# Patient Record
Sex: Female | Born: 1980 | Race: White | Hispanic: No | Marital: Single | Smoking: Former smoker
Health system: Southern US, Community
[De-identification: ages and names within clinical notes are randomized; demographics above are authoritative.]

## PROBLEM LIST (undated history)

## (undated) DIAGNOSIS — F419 Anxiety disorder, unspecified: Secondary | ICD-10-CM

## (undated) DIAGNOSIS — J4540 Moderate persistent asthma, uncomplicated: Secondary | ICD-10-CM

## (undated) DIAGNOSIS — J309 Allergic rhinitis, unspecified: Secondary | ICD-10-CM

## (undated) DIAGNOSIS — E785 Hyperlipidemia, unspecified: Secondary | ICD-10-CM

## (undated) DIAGNOSIS — F32A Depression, unspecified: Secondary | ICD-10-CM

## (undated) DIAGNOSIS — E041 Nontoxic single thyroid nodule: Secondary | ICD-10-CM

## (undated) DIAGNOSIS — J45909 Unspecified asthma, uncomplicated: Secondary | ICD-10-CM

## (undated) DIAGNOSIS — Z973 Presence of spectacles and contact lenses: Secondary | ICD-10-CM

## (undated) DIAGNOSIS — N393 Stress incontinence (female) (male): Secondary | ICD-10-CM

## (undated) DIAGNOSIS — R3914 Feeling of incomplete bladder emptying: Secondary | ICD-10-CM

## (undated) DIAGNOSIS — Z1502 Genetic susceptibility to malignant neoplasm of ovary: Secondary | ICD-10-CM

## (undated) DIAGNOSIS — N812 Incomplete uterovaginal prolapse: Secondary | ICD-10-CM

## (undated) DIAGNOSIS — Z15068 Genetic susceptibility to other malignant neoplasm of digestive system: Secondary | ICD-10-CM

## (undated) DIAGNOSIS — Z1501 Genetic susceptibility to malignant neoplasm of breast: Secondary | ICD-10-CM

## (undated) DIAGNOSIS — Z1509 Genetic susceptibility to other malignant neoplasm: Secondary | ICD-10-CM

## (undated) HISTORY — DX: Genetic susceptibility to malignant neoplasm of breast: Z15.01

## (undated) HISTORY — DX: Depression, unspecified: F32.A

## (undated) HISTORY — DX: Anxiety disorder, unspecified: F41.9

## (undated) HISTORY — DX: Genetic susceptibility to other malignant neoplasm of digestive system: Z15.068

## (undated) HISTORY — DX: Genetic susceptibility to malignant neoplasm of breast: Z15.09

## (undated) HISTORY — PX: WISDOM TOOTH EXTRACTION: SHX21

## (undated) HISTORY — DX: Genetic susceptibility to malignant neoplasm of breast: Z15.02

## (undated) HISTORY — PX: TUBAL LIGATION: SHX77

## (undated) HISTORY — DX: Hyperlipidemia, unspecified: E78.5

## (undated) HISTORY — DX: Unspecified asthma, uncomplicated: J45.909

---

## 2019-01-26 DIAGNOSIS — F32A Depression, unspecified: Secondary | ICD-10-CM | POA: Insufficient documentation

## 2020-05-08 DIAGNOSIS — F411 Generalized anxiety disorder: Secondary | ICD-10-CM | POA: Insufficient documentation

## 2020-05-08 DIAGNOSIS — J4540 Moderate persistent asthma, uncomplicated: Secondary | ICD-10-CM | POA: Insufficient documentation

## 2020-05-08 DIAGNOSIS — E785 Hyperlipidemia, unspecified: Secondary | ICD-10-CM | POA: Insufficient documentation

## 2020-07-04 DIAGNOSIS — E040 Nontoxic diffuse goiter: Secondary | ICD-10-CM | POA: Insufficient documentation

## 2020-07-04 DIAGNOSIS — F909 Attention-deficit hyperactivity disorder, unspecified type: Secondary | ICD-10-CM | POA: Insufficient documentation

## 2020-09-10 HISTORY — PX: LAPAROSCOPIC TUBAL LIGATION: SUR803

## 2020-11-07 ENCOUNTER — Telehealth: Payer: Self-pay | Admitting: *Deleted

## 2020-11-07 NOTE — Telephone Encounter (Signed)
Called and scheduled the patient for a new patient appt on 3/25 at 10:30 am; arrive at 10 am. Patient giving the address and phone number for the clinic. Patient also given the policy for mask and visitors

## 2020-11-14 ENCOUNTER — Encounter: Payer: Self-pay | Admitting: Gynecologic Oncology

## 2020-11-15 ENCOUNTER — Inpatient Hospital Stay: Payer: BC Managed Care – PPO | Attending: Gynecologic Oncology | Admitting: Gynecologic Oncology

## 2020-11-15 ENCOUNTER — Other Ambulatory Visit: Payer: Self-pay

## 2020-11-15 ENCOUNTER — Encounter: Payer: Self-pay | Admitting: Gynecologic Oncology

## 2020-11-15 ENCOUNTER — Telehealth: Payer: Self-pay | Admitting: Oncology

## 2020-11-15 VITALS — BP 131/72 | HR 87 | Temp 97.5°F | Resp 18 | Ht 67.0 in | Wt 185.0 lb

## 2020-11-15 DIAGNOSIS — F32A Depression, unspecified: Secondary | ICD-10-CM | POA: Insufficient documentation

## 2020-11-15 DIAGNOSIS — E785 Hyperlipidemia, unspecified: Secondary | ICD-10-CM | POA: Diagnosis not present

## 2020-11-15 DIAGNOSIS — F419 Anxiety disorder, unspecified: Secondary | ICD-10-CM | POA: Insufficient documentation

## 2020-11-15 DIAGNOSIS — Z1502 Genetic susceptibility to malignant neoplasm of ovary: Secondary | ICD-10-CM | POA: Diagnosis present

## 2020-11-15 DIAGNOSIS — J45909 Unspecified asthma, uncomplicated: Secondary | ICD-10-CM | POA: Insufficient documentation

## 2020-11-15 DIAGNOSIS — Z148 Genetic carrier of other disease: Secondary | ICD-10-CM | POA: Diagnosis not present

## 2020-11-15 DIAGNOSIS — Z1501 Genetic susceptibility to malignant neoplasm of breast: Secondary | ICD-10-CM | POA: Insufficient documentation

## 2020-11-15 DIAGNOSIS — Z1509 Genetic susceptibility to other malignant neoplasm: Secondary | ICD-10-CM | POA: Insufficient documentation

## 2020-11-15 DIAGNOSIS — Z79899 Other long term (current) drug therapy: Secondary | ICD-10-CM | POA: Diagnosis not present

## 2020-11-15 MED ORDER — IBUPROFEN 800 MG PO TABS: 800.0000 mg | ORAL_TABLET | Freq: Three times a day (TID) | ORAL | 0 refills | Status: DC | PRN

## 2020-11-15 MED ORDER — TRAMADOL HCL 50 MG PO TABS: 50.0000 mg | ORAL_TABLET | Freq: Four times a day (QID) | ORAL | 0 refills | Status: DC | PRN

## 2020-11-15 MED ORDER — SENNOSIDES-DOCUSATE SODIUM 8.6-50 MG PO TABS: 2.0000 | ORAL_TABLET | Freq: Every day | ORAL | 0 refills | Status: DC

## 2020-11-15 NOTE — Telephone Encounter (Signed)
Spoke with Dr. Anola Gurney with [facility].  He verified that patient's specimen from 09/10/20 surgery was serial sectioned.

## 2020-11-15 NOTE — Progress Notes (Signed)
GYNECOLOGIC ONCOLOGY NEW PATIENT CONSULTATION   Patient Name: Ebony Watson  Patient Age: 40 y.o. Date of Service: 11/15/20 Referring Provider: Self referred  Primary Care Provider: Pcp, No Consulting Provider: Jeral Pinch, MD   Assessment/Plan:  Premenopausal patient with recent diagnosis of a BRCA2 mutation.  Ebony Watson and I discussed in detail her recent genetic testing and the implications of these results. The risk of ovarian cancer in patients with BRCA 2 mutations is approximately 10-15% to the age of 40.  In addition, patients with BRCA mutations are also at increased risk of breast cancer, and an increased (but still low) risk of pancreatic cancer and melanoma.  The [employer]  (NCCN) recommends consideration of removal of bilateral ovaries and fallopian tubes starting between the ages of 47-40, and/or when childbearing is completed, to reduce the risk of ovarian and fallopian tube cancer.  In BRCA2 mutation specifically, given the later average onset of ovarian cancer, it is reasonable to delay risk reducing surgery until 15-80 years of age.  If a patient does not undergo risk-reducing surgery, it is recommended they have CA-125 serum levels and pelvic ultrasounds every 6 months as a screening approach, although these are not particularly sensitive methods of screening.  Specifically, we discussed that there is not truly a screening test for ovarian cancer.  While we use ultrasounds and CA-125, these have not been reliably shown to help diagnose ovarian cancer at an earlier stage and thus influence outcomes.  Preventive surgery to remove the ovaries and fallopian tubes reduces the risk of a related cancer by 80% in women who carry a BRCA1 or BRCA2 mutation.  Women who undergo preventive surgery retain a 4% risk of developing cancer of the peritoneum.   We discussed the role of hysterectomy.  In the setting of a BRCA2 mutation, we discussed that there  is no increased or associated risk of uterine cancer.  Given her age, no personal history of breast cancer, she would be a candidate for hormonal replacement with estrogen.  With a uterus still in situ, if we were to place her on estrogen replacement, then she would need some protection of her uterine lining.  Given risks associated with combination estrogen and progesterone, my recommendation would be protection of the uterine lining with a Mirena IUD.  The patient has started having breast cancer screening but has not met with a medical oncologist.  I offered to make a referral to our high risk breast clinic and she was amenable to this.  We tentatively scheduled risk reducing surgery with robotic oophorectomy.  Since she has already undergone bilateral salpingectomy, will confirm with the pathologist at [facility] that this included serial sectioning.  On the pathology report, is not clear to me that they did this, and at that time only her family history and not personal BRCA2 mutation status was known.  We discussed the small but present risk of an incidental cancer diagnosis at the time of surgery.  If this were to happen, then appropriate staging would be performed within the same surgery including total hysterectomy, lymph node sampling, omentectomy, and peritoneal biopsies.  If microscopic cancer was detected in the ovaries after the removal, then we would review this at a follow-up visit and discuss the need for any additional surgery.  The risks of surgery were discussed with the patient including bleeding, need for blood transfusion, infection, damage to surrounding structures with need for repair (including bowel, bladder, blood vessels, nerves), medical complications (including cardiac arrest,  pneumonia, DVT, and rarely death), wound separation, hernia, unforeseen complications, possible need for re-exploration. The patient will receive DVT and antibiotic prophylaxis as indicated. She voiced a clear  understanding. She had the opportunity to ask questions. Perioperative instructions were reviewed with her.   A copy of this note was sent to the patient's referring provider.   70 minutes of total time was spent for this patient encounter, including preparation, face-to-face counseling with the patient and coordination of care, and documentation of the encounter.   Jeral Pinch, MD  Division of Gynecologic Oncology  Department of Obstetrics and Gynecology  [facility]  ___________________________________________  Chief Complaint: Chief Complaint  Patient presents with  . BRCA2 positive    History of Present Illness:  Ebony Watson is a 40 y.o. y.o. female who is seen in consultation at the request of No ref. provider found for an evaluation of risk reducing options in the setting of BRCA2 mutation.  Patient has gotten care at [facility] and after completing childbearing plans, decided to move forward with surgical sterilization.  She was counseled on the options including bilateral salpingectomy.  On 09/10/2020, the patient underwent laparoscopic surgical sterilization with bilateral salpingectomy.  Findings at the time of surgery were an anteverted mobile uterus and cervix.  Normal bilateral adnexa noted on laparoscopy as well as a normal uterine corpus.  Pathology results from this showed fallopian tubes were negative for malignancy.  On the pathology report that we received, I do not see any evidence of serial sectioning being done on the fallopian tubes.  At the time of surgery, patient's family history was known but the patient had not had genetic testing.  She recovered well from surgery.  She denies any baseline pelvic pain.  She has a history of normal menses although her periods stopped last year after she got her third COVID booster injection and restarted in January.  This month was her first truly normal menses.  She endorses a good  appetite with some weight variation at baseline.  She denies any early satiety or bloating.  She reports normal bowel and bladder function.  She underwent genetic testing in mid February showing a pathogenic mutation in BRCA2.  She has not met with a medical oncologist.  She had her first breast mammogram last year and is scheduled for breast MRI next month.  In terms of her medical history, she is got well-controlled asthma on Advair and uses an albuterol inhaler as needed.  She has increased use with seasonal exposures.  Family history is notable for an aunt who presented with late stage ovarian cancer and was found to have a BRCA2 mutation.  She was subsequently diagnosed with an early stage breast cancer.  Patient lives in [place] with her 3 children.  She works in infection control.  PAST MEDICAL HISTORY:  Past Medical History:  Diagnosis Date  . Anxiety   . Asthma   . BRCA2 gene mutation positive in female   . Depression   . Hyperlipidemia      PAST SURGICAL HISTORY:  Past Surgical History:  Procedure Laterality Date  . TUBAL LIGATION    . WISDOM TOOTH EXTRACTION      OB/GYN HISTORY:  OB History  Gravida Para Term Preterm AB Living  5 3          SAB IAB Ectopic Multiple Live Births               # Outcome Date GA  Lbr Len/2nd Weight Sex Delivery Anes PTL Lv  5 Gravida           4 Gravida           3 Para           2 Para           1 Para             No LMP recorded.  Age at menarche: 72 Age at menopause: Insertion Hx of HRT: N/ Hx of STDs: N/A Last pap: 2021 History of abnormal pap smears: No  SCREENING STUDIES:  Last mammogram: 2021   MEDICATIONS: Outpatient Encounter Medications as of 11/15/2020  Medication Sig  . ADVAIR DISKUS 250-50 MCG/DOSE AEPB Inhale 1 puff into the lungs 2 (two) times daily.  Marland Kitchen albuterol (VENTOLIN HFA) 108 (90 Base) MCG/ACT inhaler SMARTSIG:2 Puff(s) By Mouth Every 6 Hours PRN  . Cholecalciferol 125 MCG (5000 UT) capsule Take by  mouth daily.  Marland Kitchen ibuprofen (ADVIL) 800 MG tablet Take 1 tablet (800 mg total) by mouth every 8 (eight) hours as needed for moderate pain. For AFTER surgery only  . loratadine (CLARITIN) 10 MG tablet 10 mg daily. Prn allergies  . Multiple Vitamin (MULTI-VITAMIN DAILY PO) Take 1 tablet by mouth daily.  . Omega-3 Fatty Acids (FISH OIL) 1000 MG CAPS daily.  . Probiotic Product (PROBIOTIC DAILY PO) Take 1 tablet by mouth daily.  Marland Kitchen senna-docusate (SENOKOT-S) 8.6-50 MG tablet Take 2 tablets by mouth at bedtime. For AFTER surgery, do not take if having diarrhea  . traMADol (ULTRAM) 50 MG tablet Take 1 tablet (50 mg total) by mouth every 6 (six) hours as needed for severe pain. For AFTER surgery only, do not take and drive   No facility-administered encounter medications on file as of 11/15/2020.    ALLERGIES:  Allergies  Allergen Reactions  . Hydrocodone-Acetaminophen Shortness Of Breath  . Oxycodone-Acetaminophen Shortness Of Breath     FAMILY HISTORY:  Family History  Problem Relation Age of Onset  . Drug abuse Mother   . Hypertension Mother   . Hyperlipidemia Mother   . Heart disease Father   . Prostate cancer Father   . Drug abuse Sister   . Breast cancer Maternal Aunt   . Ovarian cancer Maternal Aunt   . BRCA 1/2 Maternal Aunt   . Cervical cancer Sister   . Drug abuse Sister      SOCIAL HISTORY:  Social Connections: Not on file    REVIEW OF SYSTEMS:  Denies appetite changes, fevers, chills, fatigue, unexplained weight changes. Denies hearing loss, neck lumps or masses, mouth sores, ringing in ears or voice changes. Denies cough or wheezing.  Denies shortness of breath. Denies chest pain or palpitations. Denies leg swelling. Denies abdominal distention, pain, blood in stools, constipation, diarrhea, nausea, vomiting, or early satiety. Denies pain with intercourse, dysuria, frequency, hematuria or incontinence. Denies hot flashes, pelvic pain, vaginal bleeding or vaginal  discharge.   Denies joint pain, back pain or muscle pain/cramps. Denies itching, rash, or wounds. Denies dizziness, headaches, numbness or seizures. Denies swollen lymph nodes or glands, denies easy bruising or bleeding. Denies anxiety, depression, confusion, or decreased concentration.  Physical Exam:  Vital Signs for this encounter:  Blood pressure 131/72, pulse 87, temperature (!) 97.5 F (36.4 C), temperature source Tympanic, resp. rate 18, height '5\' 7"'  (1.702 m), weight 185 lb (83.9 kg), SpO2 100 %. Body mass index is 28.98 kg/m. General: Alert, oriented, no acute distress.  HEENT: Normocephalic, atraumatic. Sclera anicteric.  Chest: Clear to auscultation bilaterally. No wheezes, rhonchi, or rales. Cardiovascular: Regular rate and rhythm, no murmurs, rubs, or gallops.  Abdomen: Normoactive bowel sounds. Soft, nondistended, nontender to palpation. No masses or hepatosplenomegaly appreciated. No palpable fluid wave.  Well-healed laparoscopic incisions. Extremities: Grossly normal range of motion. Warm, well perfused. No edema bilaterally.   LABORATORY AND RADIOLOGIC DATA:  Outside medical records were reviewed to synthesize the above history, along with the history and physical obtained during the visit.   No results found for: WBC, HGB, HCT, PLT, GLUCOSE, CHOL, TRIG, HDL, LDLDIRECT, LDLCALC, ALT, AST, NA, K, CL, CREATININE, BUN, CO2, TSH, PSA, INR, GLUF, HGBA1C, MICROALBUR  Genetic testing performed with [facility] on 09/26/2020: Positive for pathogenic mutation in [place], [id number]

## 2020-11-15 NOTE — H&P (View-Only) (Signed)
GYNECOLOGIC ONCOLOGY NEW PATIENT CONSULTATION   Patient Name: Ebony Watson  Patient Age: 40 y.o. Date of Service: 11/15/20 Referring Provider: Self referred  Primary Care Provider: Pcp, No Consulting Provider: Jeral Pinch, MD   Assessment/Plan:  Premenopausal patient with recent diagnosis of a BRCA2 mutation.  Larene and I discussed in detail her recent genetic testing and the implications of these results. The risk of ovarian cancer in patients with BRCA 2 mutations is approximately 10-15% to the age of 42.  In addition, patients with BRCA mutations are also at increased risk of breast cancer, and an increased (but still low) risk of pancreatic cancer and melanoma.  [employer]  (NCCN) recommends consideration of removal of bilateral ovaries and fallopian tubes starting between the ages of 68-40, and/or when childbearing is completed, to reduce the risk of ovarian and fallopian tube cancer.  In BRCA2 mutation specifically, given the later average onset of ovarian cancer, it is reasonable to delay risk reducing surgery until 59-56 years of age.  If a patient does not undergo risk-reducing surgery, it is recommended they have CA-125 serum levels and pelvic ultrasounds every 6 months as a screening approach, although these are not particularly sensitive methods of screening.  Specifically, we discussed that there is not truly a screening test for ovarian cancer.  While we use ultrasounds and CA-125, these have not been reliably shown to help diagnose ovarian cancer at an earlier stage and thus influence outcomes.  Preventive surgery to remove the ovaries and fallopian tubes reduces the risk of a related cancer by 80% in women who carry a BRCA1 or BRCA2 mutation.  Women who undergo preventive surgery retain a 4% risk of developing cancer of the peritoneum.   We discussed the role of hysterectomy.  In the setting of a BRCA2 mutation, we discussed that there  is no increased or associated risk of uterine cancer.  Given her age, no personal history of breast cancer, she would be a candidate for hormonal replacement with estrogen.  With a uterus still in situ, if we were to place her on estrogen replacement, then she would need some protection of her uterine lining.  Given risks associated with combination estrogen and progesterone, my recommendation would be protection of the uterine lining with a Mirena IUD.  The patient has started having breast cancer screening but has not met with a medical oncologist.  I offered to make a referral to our high risk breast clinic and she was amenable to this.  We tentatively scheduled risk reducing surgery with robotic oophorectomy.  Since she has already undergone bilateral salpingectomy, will confirm with the pathologist at [facility] that this included serial sectioning.  On the pathology report, is not clear to me that they did this, and at that time only her family history and not personal BRCA2 mutation status was known.  We discussed the small but present risk of an incidental cancer diagnosis at the time of surgery.  If this were to happen, then appropriate staging would be performed within the same surgery including total hysterectomy, lymph node sampling, omentectomy, and peritoneal biopsies.  If microscopic cancer was detected in the ovaries after the removal, then we would review this at a follow-up visit and discuss the need for any additional surgery.  The risks of surgery were discussed with the patient including bleeding, need for blood transfusion, infection, damage to surrounding structures with need for repair (including bowel, bladder, blood vessels, nerves), medical complications (including cardiac arrest,  pneumonia, DVT, and rarely death), wound separation, hernia, unforeseen complications, possible need for re-exploration. The patient will receive DVT and antibiotic prophylaxis as indicated. She voiced a clear  understanding. She had the opportunity to ask questions. Perioperative instructions were reviewed with her.   A copy of this note was sent to the patient's referring provider.   70 minutes of total time was spent for this patient encounter, including preparation, face-to-face counseling with the patient and coordination of care, and documentation of the encounter.   Jeral Pinch, MD  Division of Gynecologic Oncology  Department of Obstetrics and Gynecology  [facility]  ___________________________________________  Chief Complaint: Chief Complaint  Patient presents with  . BRCA2 positive    History of Present Illness:  Ebony Watson is a 40 y.o. y.o. female who is seen in consultation at the request of No ref. provider found for an evaluation of risk reducing options in the setting of BRCA2 mutation.  Patient has gotten care at [facility] and after completing childbearing plans, decided to move forward with surgical sterilization.  She was counseled on the options including bilateral salpingectomy.  On 09/10/2020, the patient underwent laparoscopic surgical sterilization with bilateral salpingectomy.  Findings at the time of surgery were an anteverted mobile uterus and cervix.  Normal bilateral adnexa noted on laparoscopy as well as a normal uterine corpus.  Pathology results from this showed fallopian tubes were negative for malignancy.  On the pathology report that we received, I do not see any evidence of serial sectioning being done on the fallopian tubes.  At the time of surgery, patient's family history was known but the patient had not had genetic testing.  She recovered well from surgery.  She denies any baseline pelvic pain.  She has a history of normal menses although her periods stopped last year after she got her third COVID booster injection and restarted in January.  This month was her first truly normal menses.  She endorses a good  appetite with some weight variation at baseline.  She denies any early satiety or bloating.  She reports normal bowel and bladder function.  She underwent genetic testing in mid February showing a pathogenic mutation in BRCA2.  She has not met with a medical oncologist.  She had her first breast mammogram last year and is scheduled for breast MRI next month.  In terms of her medical history, she is got well-controlled asthma on Advair and uses an albuterol inhaler as needed.  She has increased use with seasonal exposures.  Family history is notable for an aunt who presented with late stage ovarian cancer and was found to have a BRCA2 mutation.  She was subsequently diagnosed with an early stage breast cancer.  Patient lives in [place] with her 3 children.  She works in infection control.  PAST MEDICAL HISTORY:  Past Medical History:  Diagnosis Date  . Anxiety   . Asthma   . BRCA2 gene mutation positive in female   . Depression   . Hyperlipidemia      PAST SURGICAL HISTORY:  Past Surgical History:  Procedure Laterality Date  . TUBAL LIGATION    . WISDOM TOOTH EXTRACTION      OB/GYN HISTORY:  OB History  Gravida Para Term Preterm AB Living  5 3          SAB IAB Ectopic Multiple Live Births               # Outcome Date GA  Lbr Len/2nd Weight Sex Delivery Anes PTL Lv  5 Gravida           4 Gravida           3 Para           2 Para           1 Para             No LMP recorded.  Age at menarche: 2 Age at menopause: Insertion Hx of HRT: N/ Hx of STDs: N/A Last pap: 2021 History of abnormal pap smears: No  SCREENING STUDIES:  Last mammogram: 2021   MEDICATIONS: Outpatient Encounter Medications as of 11/15/2020  Medication Sig  . ADVAIR DISKUS 250-50 MCG/DOSE AEPB Inhale 1 puff into the lungs 2 (two) times daily.  Marland Kitchen albuterol (VENTOLIN HFA) 108 (90 Base) MCG/ACT inhaler SMARTSIG:2 Puff(s) By Mouth Every 6 Hours PRN  . Cholecalciferol 125 MCG (5000 UT) capsule Take by  mouth daily.  Marland Kitchen ibuprofen (ADVIL) 800 MG tablet Take 1 tablet (800 mg total) by mouth every 8 (eight) hours as needed for moderate pain. For AFTER surgery only  . loratadine (CLARITIN) 10 MG tablet 10 mg daily. Prn allergies  . Multiple Vitamin (MULTI-VITAMIN DAILY PO) Take 1 tablet by mouth daily.  . Omega-3 Fatty Acids (FISH OIL) 1000 MG CAPS daily.  . Probiotic Product (PROBIOTIC DAILY PO) Take 1 tablet by mouth daily.  Marland Kitchen senna-docusate (SENOKOT-S) 8.6-50 MG tablet Take 2 tablets by mouth at bedtime. For AFTER surgery, do not take if having diarrhea  . traMADol (ULTRAM) 50 MG tablet Take 1 tablet (50 mg total) by mouth every 6 (six) hours as needed for severe pain. For AFTER surgery only, do not take and drive   No facility-administered encounter medications on file as of 11/15/2020.    ALLERGIES:  Allergies  Allergen Reactions  . Hydrocodone-Acetaminophen Shortness Of Breath  . Oxycodone-Acetaminophen Shortness Of Breath     FAMILY HISTORY:  Family History  Problem Relation Age of Onset  . Drug abuse Mother   . Hypertension Mother   . Hyperlipidemia Mother   . Heart disease Father   . Prostate cancer Father   . Drug abuse Sister   . Breast cancer Maternal Aunt   . Ovarian cancer Maternal Aunt   . BRCA 1/2 Maternal Aunt   . Cervical cancer Sister   . Drug abuse Sister      SOCIAL HISTORY:  Social Connections: Not on file    REVIEW OF SYSTEMS:  Denies appetite changes, fevers, chills, fatigue, unexplained weight changes. Denies hearing loss, neck lumps or masses, mouth sores, ringing in ears or voice changes. Denies cough or wheezing.  Denies shortness of breath. Denies chest pain or palpitations. Denies leg swelling. Denies abdominal distention, pain, blood in stools, constipation, diarrhea, nausea, vomiting, or early satiety. Denies pain with intercourse, dysuria, frequency, hematuria or incontinence. Denies hot flashes, pelvic pain, vaginal bleeding or vaginal  discharge.   Denies joint pain, back pain or muscle pain/cramps. Denies itching, rash, or wounds. Denies dizziness, headaches, numbness or seizures. Denies swollen lymph nodes or glands, denies easy bruising or bleeding. Denies anxiety, depression, confusion, or decreased concentration.  Physical Exam:  Vital Signs for this encounter:  Blood pressure 131/72, pulse 87, temperature (!) 97.5 F (36.4 C), temperature source Tympanic, resp. rate 18, height '5\' 7"'  (1.702 m), weight 185 lb (83.9 kg), SpO2 100 %. Body mass index is 28.98 kg/m. General: Alert, oriented, no acute distress.  HEENT: Normocephalic, atraumatic. Sclera anicteric.  Chest: Clear to auscultation bilaterally. No wheezes, rhonchi, or rales. Cardiovascular: Regular rate and rhythm, no murmurs, rubs, or gallops.  Abdomen: Normoactive bowel sounds. Soft, nondistended, nontender to palpation. No masses or hepatosplenomegaly appreciated. No palpable fluid wave.  Well-healed laparoscopic incisions. Extremities: Grossly normal range of motion. Warm, well perfused. No edema bilaterally.   LABORATORY AND RADIOLOGIC DATA:  Outside medical records were reviewed to synthesize the above history, along with the history and physical obtained during the visit.   No results found for: WBC, HGB, HCT, PLT, GLUCOSE, CHOL, TRIG, HDL, LDLDIRECT, LDLCALC, ALT, AST, NA, K, CL, CREATININE, BUN, CO2, TSH, PSA, INR, GLUF, HGBA1C, MICROALBUR  Genetic testing performed with [facility] on 09/26/2020: Positive for pathogenic mutation in [place], [id number]

## 2020-11-15 NOTE — Patient Instructions (Signed)
Preparing for your Surgery  Plan for surgery in December 10, 2020 with Dr. Jeral Pinch at [place] will be scheduled for a robotic assisted laparoscopic bilateral oophorectomy (possible bilateral salpingo-oophorectomy), possible staging if cancer identified, Mirena IUD placement.  Pre-operative Testing -You will receive a phone call from presurgical testing at [facility] to arrange for a pre-operative appointment, labs, and COVID test. The COVID test normally happens 3 days prior to the surgery and they ask that you self quarantine after the test up until surgery to decrease chance of exposure.  -Bring your insurance card, copy of an advanced directive if applicable, medication list  -At that visit, you will be asked to sign a consent for a possible blood transfusion in case a transfusion becomes necessary during surgery.  The need for a blood transfusion is rare but having consent is a necessary part of your care.     -You should not be taking blood thinners or aspirin at least ten days prior to surgery unless instructed by your surgeon.  -Do not take supplements such as fish oil (omega 3), red yeast rice, turmeric before your surgery. You want to avoid medications with aspirin in them including headache powders such as BC or Goody's), Excedrin migraine. PLEASE STOP FISH OIL NOW.  Day Before Surgery at [place] will be asked to take in a light diet the day before surgery. You will be advised you can have clear liquids up until 3 hours before your surgery.    Eat a light diet the day before surgery.  Examples including soups, broths, toast, yogurt, mashed potatoes.  AVOID GAS PRODUCING FOODS. Things to avoid include carbonated beverages (fizzy beverages, sodas), raw fruits and raw vegetables (uncooked), or beans.   If your bowels are filled with gas, your surgeon will have difficulty visualizing your pelvic organs which increases your surgical risks.  Your role in  recovery Your role is to become active as soon as directed by your doctor, while still giving yourself time to heal.  Rest when you feel tired. You will be asked to do the following in order to speed your recovery:  - Cough and breathe deeply. This helps to clear and expand your lungs and can prevent pneumonia after surgery.  - [place]. Do mild physical activity. Walking or moving your legs help your circulation and body functions return to normal. Do not try to get up or walk alone the first time after surgery.   -If you develop swelling on one leg or the other, pain in the back of your leg, redness/warmth in one of your legs, please call the office or go to the Emergency Room to have a doppler to rule out a blood clot. For shortness of breath, chest pain-seek care in the Emergency Room as soon as possible. - Actively manage your pain. Managing your pain lets you move in comfort. We will ask you to rate your pain on a scale of zero to 10. It is your responsibility to tell your doctor or nurse where and how much you hurt so your pain can be treated.  Special Considerations -If you are diabetic, you may be placed on insulin after surgery to have closer control over your blood sugars to promote healing and recovery.  This does not mean that you will be discharged on insulin.  If applicable, your oral antidiabetics will be resumed when you are tolerating a solid diet.  -Your final pathology results  from surgery should be available around one week after surgery and the results will be relayed to you when available.  -Dr. Lahoma Crocker is the surgeon that assists your GYN Oncologist with surgery.  If you end up staying the night, the next day after your surgery you will either see Dr. Denman George, Dr. Berline Lopes, or Dr. Lahoma Crocker.  -FMLA forms can be faxed to [phone] and please allow 5-7 business days for completion.  Pain Management After Surgery -You have been prescribed  your pain medication and bowel regimen medications before surgery so that you can have these available when you are discharged from the hospital. The pain medication is for use ONLY AFTER surgery and a new prescription will not be given.   -Make sure that you have Tylenol and Ibuprofen at home to use on a regular basis after surgery for pain control. We recommend alternating the medications every hour to six hours since they work differently and are processed in the body differently for pain relief.  -Review the attached handout on narcotic use and their risks and side effects.   Bowel Regimen -You have been prescribed Sennakot-S to take nightly to prevent constipation especially if you are taking the narcotic pain medication intermittently.  It is important to prevent constipation and drink adequate amounts of liquids. You can stop taking this medication when you are not taking pain medication and you are back on your normal bowel routine.  Risks of Surgery Risks of surgery are low but include bleeding, infection, damage to surrounding structures, re-operation, blood clots, and very rarely death.   Blood Transfusion Information (For the consent to be signed before surgery)  We will be checking your blood type before surgery so in case of emergencies, we will know what type of blood you would need.                                            WHAT IS A BLOOD TRANSFUSION?  A transfusion is the replacement of blood or some of its parts. Blood is made up of multiple cells which provide different functions.  Red blood cells carry oxygen and are used for blood loss replacement.  White blood cells fight against infection.  Platelets control bleeding.  Plasma helps clot blood.  Other blood products are available for specialized needs, such as hemophilia or other clotting disorders. BEFORE THE TRANSFUSION  Who gives blood for transfusions?   You may be able to donate blood to be used at a  later date on yourself (autologous donation).  Relatives can be asked to donate blood. This is generally not any safer than if you have received blood from a stranger. The same precautions are taken to ensure safety when a relative's blood is donated.  Healthy volunteers who are fully evaluated to make sure their blood is safe. This is blood bank blood. Transfusion therapy is the safest it has ever been in the practice of medicine. Before blood is taken from a donor, a complete history is taken to make sure that person has no history of diseases nor engages in risky social behavior (examples are intravenous drug use or sexual activity with multiple partners). The donor's travel history is screened to minimize risk of transmitting infections, such as malaria. The donated blood is tested for signs of infectious diseases, such as HIV and hepatitis. The blood is then tested  to be sure it is compatible with you in order to minimize the chance of a transfusion reaction. If you or a relative donates blood, this is often done in anticipation of surgery and is not appropriate for emergency situations. It takes many days to process the donated blood. RISKS AND COMPLICATIONS Although transfusion therapy is very safe and saves many lives, the main dangers of transfusion include:   Getting an infectious disease.  Developing a transfusion reaction. This is an allergic reaction to something in the blood you were given. Every precaution is taken to prevent this. The decision to have a blood transfusion has been considered carefully by your caregiver before blood is given. Blood is not given unless the benefits outweigh the risks.  AFTER SURGERY INSTRUCTIONS  Return to work: 4 weeks if applicable  Activity: 1. Be up and out of the bed during the day.  Take a nap if needed.  You may walk up steps but be careful and use the hand rail.  Stair climbing will tire you more than you think, you may need to stop part way  and rest.   2. No lifting or straining for 6 weeks over 10 pounds. No pushing, pulling, straining for 6 weeks.  3. No driving for around 1 week(s).  Do not drive if you are taking narcotic pain medicine and make sure that your reaction time has returned.   4. You can shower as soon as the next day after surgery. Shower daily.  Use your regular soap and water (not directly on the incision) and pat your incision(s) dry afterwards; don't rub.  No tub baths or submerging your body in water until cleared by your surgeon. If you have the soap that was given to you by pre-surgical testing that was used before surgery, you do not need to use it afterwards because this can irritate your incisions.   5. No sexual activity and nothing in the vagina for 4 weeks.  6. You may experience a small amount of clear drainage from your incisions, which is normal.  If the drainage persists, increases, or changes color please call the office.  7. Do not use creams, lotions, or ointments such as neosporin on your incisions after surgery until advised by your surgeon because they can cause removal of the dermabond glue on your incisions.    8. You may experience vaginal spotting after surgery.  The spotting is normal but if you experience heavy bleeding, call our office.  9. Take Tylenol or ibuprofen first for pain and only use narcotic pain medication for severe pain not relieved by the Tylenol or Ibuprofen.  Monitor your Tylenol intake to a max of 4,000 mg in a 24 hour period. You can alternate these medications after surgery.  Diet: 1. Low sodium Heart Healthy Diet is recommended but you are cleared to resume your normal (before surgery) diet after your procedure.  2. It is safe to use a laxative, such as Miralax or Colace, if you have difficulty moving your bowels. You have been prescribed Sennakot at bedtime every evening to keep bowel movements regular and to prevent constipation.    Wound Care: 1. Keep clean  and dry.  Shower daily.  Reasons to call the Doctor:  Fever - Oral temperature greater than 100.4 degrees Fahrenheit  Foul-smelling vaginal discharge  Difficulty urinating  Nausea and vomiting  Increased pain at the site of the incision that is unrelieved with pain medicine.  Difficulty breathing with or without chest  pain  New calf pain especially if only on one side  Sudden, continuing increased vaginal bleeding with or without clots.   Contacts: For questions or concerns you should contact:  Dr. Jeral Pinch at [phone]  Joylene John, NP at [phone]  After Hours: call [phone] and have the GYN Oncologist paged/contacted (after 5 pm or on the weekends).  Messages sent via mychart are for non-urgent matters and are not responded to after hours so for urgent needs, please call the after hours number.   Intrauterine Device Insertion  An intrauterine device (IUD) is a medical device that gets inserted into the uterus to prevent pregnancy. It is a small, T-shaped device that has one or two nylon strings hanging down from it. The strings hang out of the lower part of the uterus (cervix) to allow for future IUD removal. There are two types of IUDs available:  Copper IUD. This type of IUD has copper wire wrapped around it. Copper makes the uterus and fallopian tubes produce a fluid that kills sperm. A copper IUD may last up to 10 years.  Hormone IUD. This type of IUD is made of plastic and contains the hormone progestin (synthetic progesterone). The hormone thickens mucus in the cervix and prevents sperm from entering the uterus. It also thins the uterine lining to prevent implantation of a fertilized egg. The hormone can weaken or kill the sperm that get into the uterus. A hormone IUD may last 3-5 years. Tell a health care provider about:  Any allergies you have.  All medicines you are taking, including vitamins, herbs, eye drops, creams, and over-the-counter  medicines.  Any problems you or family members have had with anesthetic medicines.  Any blood disorders you have.  Any surgeries you have had.  Any medical conditions you have, including any STIs (sexually transmitted infections) you may have.  Whether you are pregnant or may be pregnant. What are the risks? Generally, this is a safe procedure. However, problems may occur, including:  Infection.  Bleeding.  Allergic reactions to medicines.  Accidental puncture (perforation) of the uterus, or damage to other structures or organs.  Accidental placement of the IUD either in the muscle layer of the uterus (myometrium) or outside the uterus.  The IUD falling out of the uterus (expulsion). This is more common among women who have recently had a child.  Pregnancy that happens in the fallopian tube (ectopic pregnancy).  Infection of the uterus and fallopian tubes (pelvic inflammatory disease). What happens before the procedure?  Schedule the IUD insertion for when you will have your menstrual period or right after, to make sure you are not pregnant. Placement of the IUD is better tolerated shortly after a menstrual cycle.  Follow instructions from your health care provider about eating or drinking restrictions.  Ask your health care provider about changing or stopping your regular medicines. This is especially important if you are taking diabetes medicines or blood thinners.  You may get a pain reliever to take before the procedure.  You may have tests for:  Pregnancy. A pregnancy test involves having a urine sample taken.  STIs. Placing an IUD in someone who has an STI can make the infection worse.  Cervical cancer. You may have a Pap test to check for this type of cancer. This means collecting cells from your cervix to be examined under a microscope.  You may have a physical exam to determine the size and position of your uterus. The procedure may vary among  health care  providers and hospitals. What happens during the procedure?  A tool (speculum) will be placed in your vagina and widened so that your health care provider can see your cervix.  Medicine may be applied to your cervix to help lower your risk of infection (antiseptic medicine).  You may be given an anesthetic medicine to numb each side of your cervix (intracervical block or paracervical block). This medicine is usually given by an injection into the cervix.  A tool (uterine sound) will be inserted into your uterus to determine the length of your uterus and the direction that your uterus may be tilted.  A slim instrument or tube (IUD inserter) that holds the IUD will be inserted into your vagina, through your cervical canal, and into your uterus.  The IUD will be placed in the uterus, and the IUD inserter will be removed.  The strings that are attached to the IUD will be trimmed so that they lie just below the cervix. The procedure may vary among health care providers and hospitals. What happens after the procedure?  You may have bleeding after the procedure. This is normal. It varies from light bleeding (spotting) for a few days to menstrual-like bleeding.  You may have cramping and pain.  You may feel dizzy or light-headed.  You may have lower back pain. Summary  An intrauterine device (IUD) is a small, T-shaped device that has one or two nylon strings hanging down from it.  Two types of IUDs are available. You may have a copper IUD or a hormone IUD.  Schedule the IUD insertion for when you will have your menstrual period or right after, to make sure you are not pregnant. Placement of the IUD is better tolerated shortly after a menstrual cycle.  You may have bleeding after the procedure. This is normal. It varies from light spotting for a few days to menstrual-like bleeding. This information is not intended to replace advice given to you by your health care provider. Make sure you  discuss any questions you have with your health care provider. Document Released: 04/08/2011 Document Revised: 07/01/2016 Document Reviewed: 07/01/2016 Elsevier Interactive Patient Education  2017 [employer].  Levonorgestrel intrauterine device (IUD) What is this medicine? LEVONORGESTREL IUD (LEE voe nor jes trel) is a contraceptive (birth control) device. The device is placed inside the uterus by a healthcare professional. It is used to prevent pregnancy. This device can also be used to treat heavy bleeding that occurs during your period. This medicine may be used for other purposes; ask your health care provider or pharmacist if you have questions. COMMON BRAND NAME(S): Minette Headland What should I tell my health care provider before I take this medicine? They need to know if you have any of these conditions: -abnormal Pap smear -cancer of the breast, uterus, or cervix -diabetes -endometritis -genital or pelvic infection now or in the past -have more than one sexual partner or your partner has more than one partner -heart disease -history of an ectopic or tubal pregnancy -immune system problems -IUD in place -liver disease or tumor -problems with blood clots or take blood-thinners -seizures -use intravenous drugs -uterus of unusual shape -vaginal bleeding that has not been explained -an unusual or allergic reaction to levonorgestrel, other hormones, silicone, or polyethylene, medicines, foods, dyes, or preservatives -pregnant or trying to get pregnant -breast-feeding How should I use this medicine? This device is placed inside the uterus by a health care professional. Talk to your pediatrician  regarding the use of this medicine in children. Special care may be needed. Overdosage: If you think you have taken too much of this medicine contact a poison control center or emergency room at once. NOTE: This medicine is only for you. Do not share this medicine with  others. What if I miss a dose? This does not apply. Depending on the brand of device you have inserted, the device will need to be replaced every 3 to 5 years if you wish to continue using this type of birth control. What may interact with this medicine? Do not take this medicine with any of the following medications: -amprenavir -bosentan -fosamprenavir This medicine may also interact with the following medications: -aprepitant -armodafinil -barbiturate medicines for inducing sleep or treating seizures -bexarotene -boceprevir -griseofulvin -medicines to treat seizures like carbamazepine, ethotoin, felbamate, oxcarbazepine, phenytoin, topiramate -modafinil -pioglitazone -rifabutin -rifampin -rifapentine -some medicines to treat HIV infection like atazanavir, efavirenz, indinavir, lopinavir, nelfinavir, tipranavir, ritonavir -St. John's wort -warfarin This list may not describe all possible interactions. Give your health care provider a list of all the medicines, herbs, non-prescription drugs, or dietary supplements you use. Also tell them if you smoke, drink alcohol, or use illegal drugs. Some items may interact with your medicine. What should I watch for while using this medicine? Visit your doctor or health care professional for regular check ups. See your doctor if you or your partner has sexual contact with others, becomes HIV positive, or gets a sexual transmitted disease. This product does not protect you against HIV infection (AIDS) or other sexually transmitted diseases. You can check the placement of the IUD yourself by reaching up to the top of your vagina with clean fingers to feel the threads. Do not pull on the threads. It is a good habit to check placement after each menstrual period. Call your doctor right away if you feel more of the IUD than just the threads or if you cannot feel the threads at all. The IUD may come out by itself. You may become pregnant if the device  comes out. If you notice that the IUD has come out use a backup birth control method like condoms and call your health care provider. Using tampons will not change the position of the IUD and are okay to use during your period. This IUD can be safely scanned with magnetic resonance imaging (MRI) only under specific conditions. Before you have an MRI, tell your healthcare provider that you have an IUD in place, and which type of IUD you have in place. What side effects may I notice from receiving this medicine? Side effects that you should report to your doctor or health care professional as soon as possible: -allergic reactions like skin rash, itching or hives, swelling of the face, lips, or tongue -fever, flu-like symptoms -genital sores -high blood pressure -no menstrual period for 6 weeks during use -pain, swelling, warmth in the leg -pelvic pain or tenderness -severe or sudden headache -signs of pregnancy -stomach cramping -sudden shortness of breath -trouble with balance, talking, or walking -unusual vaginal bleeding, discharge -yellowing of the eyes or skin Side effects that usually do not require medical attention (report to your doctor or health care professional if they continue or are bothersome): -acne -breast pain -change in sex drive or performance -changes in weight -cramping, dizziness, or faintness while the device is being inserted -headache -irregular menstrual bleeding within first 3 to 6 months of use -nausea This list may not describe all possible side  effects. Call your doctor for medical advice about side effects. You may report side effects to FDA at 1-800-FDA-1088. Where should I keep my medicine? This does not apply. NOTE: This sheet is a summary. It may not cover all possible information. If you have questions about this medicine, talk to your doctor, pharmacist, or health care provider.  2018 Elsevier/Gold Standard (2016-05-22 14:14:56)

## 2020-11-18 ENCOUNTER — Telehealth: Payer: Self-pay | Admitting: Hematology and Oncology

## 2020-11-18 NOTE — Telephone Encounter (Signed)
Received a referral from Dr. Berline Lopes for Ebony Watson to be seen in the high risk breast clinic. Ebony Watson has been cld and scheduled to see Dr. Chryl Heck on 4/4 at 1040am.Pt aware to arrive 20 minutes early.

## 2020-11-20 ENCOUNTER — Other Ambulatory Visit: Payer: Self-pay | Admitting: Gynecologic Oncology

## 2020-11-20 DIAGNOSIS — Z1501 Genetic susceptibility to malignant neoplasm of breast: Secondary | ICD-10-CM

## 2020-11-20 DIAGNOSIS — Z1509 Genetic susceptibility to other malignant neoplasm: Secondary | ICD-10-CM

## 2020-11-25 ENCOUNTER — Other Ambulatory Visit: Payer: Self-pay

## 2020-11-25 ENCOUNTER — Inpatient Hospital Stay: Payer: BC Managed Care – PPO | Attending: Gynecologic Oncology | Admitting: Hematology and Oncology

## 2020-11-25 ENCOUNTER — Encounter: Payer: Self-pay | Admitting: Hematology and Oncology

## 2020-11-25 ENCOUNTER — Telehealth: Payer: Self-pay | Admitting: Hematology and Oncology

## 2020-11-25 DIAGNOSIS — Z803 Family history of malignant neoplasm of breast: Secondary | ICD-10-CM | POA: Diagnosis not present

## 2020-11-25 DIAGNOSIS — Z1509 Genetic susceptibility to other malignant neoplasm: Secondary | ICD-10-CM

## 2020-11-25 DIAGNOSIS — Z800 Family history of malignant neoplasm of digestive organs: Secondary | ICD-10-CM | POA: Insufficient documentation

## 2020-11-25 DIAGNOSIS — Z1501 Genetic susceptibility to malignant neoplasm of breast: Secondary | ICD-10-CM

## 2020-11-25 DIAGNOSIS — Z87891 Personal history of nicotine dependence: Secondary | ICD-10-CM | POA: Insufficient documentation

## 2020-11-25 DIAGNOSIS — Z8049 Family history of malignant neoplasm of other genital organs: Secondary | ICD-10-CM | POA: Insufficient documentation

## 2020-11-25 DIAGNOSIS — Z1502 Genetic susceptibility to malignant neoplasm of ovary: Secondary | ICD-10-CM | POA: Diagnosis present

## 2020-11-25 MED ORDER — ENOXAPARIN SODIUM 30 MG/0.3ML ~~LOC~~ SOLN: 60.0000 mg | Freq: Two times a day (BID) | SUBCUTANEOUS | 0 refills | Status: DC

## 2020-11-25 NOTE — Assessment & Plan Note (Addendum)
BRCA2 gene mutation: I discussed with the patient that BRCA2 is a tumor suppressor gene which helps repair damaged DNA or destroy cells if they cannot be repaired. In patients with BRCA1 or 2 mutations, the damaged DNA could not be repaired properly increasing the risk of cancers. BRCA2 gene is located on long arm of chromosome 13. These mutations are inherited in autosomal dominant fashion and hence 50% probability that their children may have a BRCA mutation.  Cancer risk:  Average to risk of cancer by age of 31: (Antoniou et al pooled pedigree data from 82 studies of 8139 index patients with breast or ovarian cancer) Breast cancer risk: 45 percent (95% CI, 33 to 54 percent) Ovarian cancer risk: 11 percent (95% CI, 4.1 to 18 percent)  [place] study: Tumor limited to risk by age of 38: Breast cancer risk: 64 percent (95% CI, 41 to 70 percent) Ovarian cancer risk: 16.5 percent (95% CI, 7.5 to 34 percent)  I discussed the difference between BRCA1 and BRCA2 wherein patients BRCA1 patients have early onset disease and much higher risk of breast cancer. The mean age of diagnosis of breast cancer between BRCA1 and 2 are 23 versus 25 years, risk of ovarian cancer is also higher with BRCA1 but the overall risk under the age of 70 is very low.  Other cancer risks:  1. Pancreatic cancer (relative risk is 3.51) with incidence of 4.9% in BRCA2 carriers 2. Fallopian tube carcinoma and primary peritoneal carcinoma 3. Uterine papillary serous carcinoma: Overall risk is very low 4. Colorectal cancers: In many studies there was no increased risk But there may be some for BRCA1 carriers 5. Melanoma and other skin cancers: The risk of oatmeal melanoma is increase in BRCA2 mutation carriers but it still extremely rare. 6. Endometrial cancer: Not very clear in terms of risks it is thought to be very low  Breast cancer risk reduction/surveillance: 1. Annual mammogram and breast MRI are recommended by NCCN starting at  age of 68-30 2. Chemoprevention with tamoxifen-like agents is not entirely clear. Based on NSABP P1 study in the small cohort of patients with BRCA1 mutations, tamoxifen to reduce breast cancer risk by 62% and BRCA2 carriers but not in BRCA1 carriers. The study is limited because of small numbers. I do not recommend it primarily because most commonly patients with BRCA mutations tend to have triple negative disease. 3. After childbearing, evaluation for prophylactic bilateral mastectomy is an option. 4. Oophorectomy self reduces the risk of breast cancer by 50% 5. Breast self-examinations starting at age 43   Ovarian cancer risk reduction: 1. Risk reducing bilateral salpingo-oophorectomy between the ages of 76-40 once childbearing is complete is recommended. In one study that was 72% reduction of risk of ovarian cancer and a decrease in breast cancer by approximately 50% 2. There is no clear data for surveillance with CA125 or vaginal ultrasounds 2. Oral contraception dose may be protective against ovarian cancer but it may slightly increase risk of breast cancer.  PLAN She has an MRI of breast scheduled for tomorrow. Mammogram done recently, BIRADS 1. SBE recommended, life style modifications discussed. She would like to wait on decision about prophylactic mastectomy She is scheduled for BSO in about 2 weeks. Return to clinic in about 6 months.

## 2020-11-25 NOTE — Progress Notes (Signed)
[facility] CONSULT NOTE  Patient Care Team: Pcp, No as PCP - General  CHIEF COMPLAINTS/PURPOSE OF CONSULTATION:   BRCA 2 positive.  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 In patients with BRCA1 or 2 mutations, the damaged DNA could not be repaired properly increasing the risk of cancers. BRCA2 gene is located on long arm of chromosome 13. These mutations are inherited in autosomal dominant fashion and hence 50% probability that their children may have a BRCA mutation.  Cancer risk:  Average to risk of cancer by age of 55: (Ebony Watson et al pooled pedigree data from 53 studies of 8139 index patients with breast or ovarian cancer) Breast cancer risk: 45 percent (95% CI, 33 to 54 percent) Ovarian cancer risk: 11 percent (95% CI, 4.1 to 18 percent)  Ebony Watson study: Tumor limited to risk by age of 10: Breast cancer risk: 52 percent (95% CI, 41 to 70 percent) Ovarian cancer risk: 16.5 percent (95% CI, 7.5 to 34 percent)  I discussed the difference between BRCA1 and BRCA2 wherein patients BRCA1 patients have early onset disease and much higher risk of breast cancer. The mean age of diagnosis of breast cancer between BRCA1 and 2 are 95 versus 66 years, risk of ovarian cancer is also higher with BRCA1 but the overall risk under the age of 57 is very low.  Other cancer risks:  1. Pancreatic cancer (relative risk is 3.51) with incidence of 4.9% in BRCA2 carriers 2. Fallopian tube carcinoma and primary peritoneal carcinoma 3. Uterine papillary serous carcinoma: Overall risk is very low 4. Colorectal cancers: In many studies there was no increased risk But there may be some for BRCA1 carriers 5. Melanoma and other skin cancers: The risk of oatmeal melanoma is increase in BRCA2 mutation carriers but it still extremely  rare. 6. Endometrial cancer: Not very clear in terms of risks it is thought to be very low  Breast cancer risk reduction/surveillance: 1. Annual mammogram and breast MRI are recommended by NCCN starting at age of 87-30 2. Chemoprevention with tamoxifen-like agents is not entirely clear. Based on NSABP P1 study in the small cohort of patients with BRCA1 mutations, tamoxifen to reduce breast cancer risk by 62% and BRCA2 carriers but not in BRCA1 carriers. The study is limited because of small numbers. I do not recommend it primarily because most commonly patients with BRCA mutations tend to have triple negative disease. 3. After childbearing, evaluation for prophylactic bilateral mastectomy is an option. 4. Oophorectomy self reduces the risk of breast cancer by 50% 5. Breast self-examinations starting at age 57   Ovarian cancer risk reduction: 1. Risk reducing bilateral salpingo-oophorectomy between the ages of 45-40 once childbearing is complete is recommended. In one study that was 72% reduction of risk of ovarian cancer and a decrease in breast cancer by approximately 50% 2. There is no clear data for surveillance with CA125 or vaginal ultrasounds 2. Oral contraception dose may be protective against ovarian cancer but it may slightly increase risk of breast cancer.  PLAN She has an MRI of breast scheduled for tomorrow. Mammogram done recently, BIRADS 1. SBE recommended, life style modifications discussed. She would like to wait on decision about prophylactic mastectomy She is scheduled for BSO in about 2 weeks.   No orders of the defined types were placed in  this encounter.   HISTORY OF PRESENTING ILLNESS:   Ebony Watson 40 y.o. female is here because of BRCA 2 mutation.  This is a very pleasant 40 yr old female patient with no PMH significant for Asthma and BRCA 2 gene mutation referred to High risk breast cancer clinic for further recommendations. [name] has a family  history significant for BRCA2 mutation in her maternal aunt who had both breast and ovarian cancer.  She also has family history of prostate cancer in her dad and cervical cancer in her sister.  She had genetic testing given BRCA2 and her maternal aunt and she was found to have BRCA2 gene mutation.  She is followed up at [facility] but decided to transfer to [facility] health given some scheduling issues. She is here for an initial visit.  She is very healthy except for the genetic mutation.  She has 3 children age 20, 6 and 14.  She did use birth control in the past but not for prolonged period of time.  She most recently had a mammogram which did not show any evidence of breast abnormalities.  She has an MRI of her breast scheduled for tomorrow. She is proceeding with bilateral salpingo-oophorectomy scheduled in 2 weeks from now. Rest of the pertinent 10 point ROS reviewed and negative.  REVIEW OF SYSTEMS:   Constitutional: Denies fevers, chills or abnormal night sweats Eyes: Denies blurriness of vision, double vision or watery eyes Ears, nose, mouth, throat, and face: Denies mucositis or sore throat Respiratory: Denies cough, dyspnea or wheezes Cardiovascular: Denies palpitation, chest discomfort or lower extremity swelling Gastrointestinal:  Denies nausea, heartburn or change in bowel habits Skin: Denies abnormal skin rashes Lymphatics: Denies new lymphadenopathy or easy bruising Neurological:Denies numbness, tingling or new weaknesses Behavioral/Psych: Mood is stable, no new changes  All other systems were reviewed with the patient and are negative.  MEDICAL HISTORY:  Past Medical History:  Diagnosis Date  . Anxiety   . Asthma   . BRCA2 gene mutation positive in female   . Depression   . Hyperlipidemia     SURGICAL HISTORY: Past Surgical History:  Procedure Laterality Date  . TUBAL LIGATION    . WISDOM TOOTH EXTRACTION      SOCIAL HISTORY: Social History   Socioeconomic  History  . Marital status: Single    Spouse name: Not on file  . Number of children: Not on file  . Years of education: Not on file  . Highest education level: Not on file  Occupational History  . Occupation: infection control  Tobacco Use  . Smoking status: Former Smoker    Packs/day: 0.25    Years: 4.00    Pack years: 1.00    Types: Cigarettes    Quit date: 2007    Years since quitting: 15.2  . Smokeless tobacco: Never Used  Vaping Use  . Vaping Use: Never used  Substance and Sexual Activity  . Alcohol use: Yes    Comment: socially  . Drug use: Never  . Sexual activity: Yes    Birth control/protection: Surgical  Other Topics Concern  . Not on file  Social History Narrative  . Not on file   Social Determinants of Health   Financial Resource Strain: Not on file  Food Insecurity: Not on file  Transportation Needs: Not on file  Physical Activity: Not on file  Stress: Not on file  Social Connections: Not on file  Intimate Partner Violence: Not on file  FAMILY HISTORY: Family History  Problem Relation Age of Onset  . Drug abuse Mother   . Hypertension Mother   . Hyperlipidemia Mother   . Heart disease Father   . Prostate cancer Father   . Drug abuse Sister   . Breast cancer Maternal Aunt   . Ovarian cancer Maternal Aunt   . BRCA 1/2 Maternal Aunt   . Cervical cancer Sister   . Drug abuse Sister     ALLERGIES:  is allergic to hydrocodone-acetaminophen and oxycodone-acetaminophen.  MEDICATIONS:  Current Outpatient Medications  Medication Sig Dispense Refill  . ADVAIR DISKUS 250-50 MCG/DOSE AEPB Inhale 1 puff into the lungs 2 (two) times daily.    Marland Kitchen albuterol (VENTOLIN HFA) 108 (90 Base) MCG/ACT inhaler Inhale 2 puffs into the lungs every 4 (four) hours as needed (Asthma).    . Cholecalciferol 125 MCG (5000 UT) capsule Take 5,000 Units by mouth daily.    Marland Kitchen ibuprofen (ADVIL) 800 MG tablet Take 1 tablet (800 mg total) by mouth every 8 (eight) hours as needed  for moderate pain. For AFTER surgery only 30 tablet 0  . loratadine (CLARITIN) 10 MG tablet Take 10 mg by mouth daily.    . Multiple Vitamin (MULTI-VITAMIN DAILY PO) Take 1 tablet by mouth daily.    . Omega-3 Fatty Acids (FISH OIL PO) Take 1 capsule by mouth daily.    . Probiotic Product (PROBIOTIC DAILY PO) Take 1 tablet by mouth daily.    Marland Kitchen senna-docusate (SENOKOT-S) 8.6-50 MG tablet Take 2 tablets by mouth at bedtime. For AFTER surgery, do not take if having diarrhea 30 tablet 0  . traMADol (ULTRAM) 50 MG tablet Take 1 tablet (50 mg total) by mouth every 6 (six) hours as needed for severe pain. For AFTER surgery only, do not take and drive 10 tablet 0   No current facility-administered medications for this visit.     PHYSICAL EXAMINATION:  ECOG PERFORMANCE STATUS: 0 - Asymptomatic  Vitals:   11/25/20 1102  BP: 124/74  Pulse: 78  Resp: 18  Temp: (!) 97.4 F (36.3 C)  SpO2: 98%   Filed Weights   11/25/20 1102  Weight: 189 lb 14.4 oz (86.1 kg)    GENERAL:alert, no distress and comfortable LYMPH:  no palpable lymphadenopathy in the cervical, axillary ABDOMEN:abdomen soft, non-tender and normal bowel sounds Musculoskeletal:no cyanosis of digits and no clubbing  PSYCH: alert & oriented x 3 with fluent speech NEURO: no focal motor/sensory deficits Breast: Bilateral breasts inspected, no concerns. No regional lymphadenopathy.  LABORATORY DATA:  I have reviewed the data as listed No results found for: WBC, HGB, HCT, MCV, PLT   Chemistry   No results found for: NA, K, CL, CO2, BUN, CREATININE, GLU No results found for: CALCIUM, ALKPHOS, AST, ALT, BILITOT    RADIOGRAPHIC STUDIES: I have personally reviewed the radiological images as listed and agreed with the findings in the report. No results found.  All questions were answered. The patient knows to call the clinic with any problems, questions or concerns. I spent 60 minutes in the care of this patient including H and P,  review of records, counseling and coordination of care.     Benay Pike, MD 11/25/2020 11:49 AM

## 2020-11-25 NOTE — Telephone Encounter (Signed)
Scheduled per los. Confirmed appts. Declined printout  

## 2020-11-26 ENCOUNTER — Other Ambulatory Visit: Payer: Self-pay | Admitting: Hematology and Oncology

## 2020-11-26 ENCOUNTER — Encounter: Payer: Self-pay | Admitting: Hematology and Oncology

## 2020-11-26 DIAGNOSIS — Z1501 Genetic susceptibility to malignant neoplasm of breast: Secondary | ICD-10-CM

## 2020-11-27 ENCOUNTER — Encounter: Payer: Self-pay | Admitting: Gynecologic Oncology

## 2020-11-28 ENCOUNTER — Telehealth: Payer: Self-pay | Admitting: *Deleted

## 2020-11-28 NOTE — Telephone Encounter (Signed)
[facility] called for the patient's disability. Confirmed the patient's surgery date, procedure and reason for the surgery.

## 2020-11-29 NOTE — Patient Instructions (Addendum)
DUE TO COVID-19 ONLY ONE VISITOR IS ALLOWED TO COME WITH YOU AND STAY IN THE WAITING ROOM ONLY DURING PRE OP AND PROCEDURE DAY OF SURGERY. THE 1 VISITOR  MAY VISIT WITH YOU AFTER SURGERY IN YOUR PRIVATE ROOM DURING VISITING HOURS ONLY!  YOU NEED TO HAVE A COVID 19 TEST ON: 12/06/20 @ 9:00 AM , THIS TEST MUST BE DONE BEFORE SURGERY,  COVID TESTING SITE [place]  [address], IT IS ON THE RIGHT GOING OUT [address] APPROXIMATELY  2 MINUTES PAST [employer] ON THE RIGHT. ONCE YOUR COVID TEST IS COMPLETED,  PLEASE BEGIN THE QUARANTINE INSTRUCTIONS AS OUTLINED IN YOUR HANDOUT.                Ebony Watson   Your procedure is scheduled on: 12/10/20   Report to [facility] Main  Entrance   Report to admitting at: 9:00 AM     Call this number if you have problems the morning of surgery [phone]    Remember: Do not eat solid food :After Midnight. Clear liquids until: 8:00 am.   CLEAR LIQUID DIET  Foods Allowed                                                                     Foods Excluded  Coffee and tea, regular and decaf                             liquids that you cannot  Plain Jell-O any favor except red or purple                                           see through such as: Fruit ices (not with fruit pulp)                                     milk, soups, orange juice  Iced Popsicles                                    All solid food Carbonated beverages, regular and diet                                    Cranberry, grape and apple juices Sports drinks like Gatorade Lightly seasoned clear broth or consume(fat free) Sugar, honey syrup  Sample Menu Breakfast                                Lunch                                     Supper Cranberry juice  Beef broth                            Chicken broth Jell-O                                     Grape juice                           Apple juice Coffee or tea                         Jell-O                                      Popsicle                                                Coffee or tea                        Coffee or tea  _____________________________________________________________________  BRUSH YOUR TEETH MORNING OF SURGERY AND RINSE YOUR MOUTH OUT, NO CHEWING GUM CANDY OR MINTS.    Take these medicines the morning of surgery with A SIP OF WATER: loratadine.Use inhalers as usual.                               You may not have any metal on your body including hair pins and              piercings  Do not wear jewelry, make-up, lotions, powders or perfumes, deodorant             Do not wear nail polish on your fingernails.  Do not shave  48 hours prior to surgery.    Do not bring valuables to the hospital. [place].  Contacts, dentures or bridgework may not be worn into surgery.  Leave suitcase in the car. After surgery it may be brought to your room.     Patients discharged the day of surgery will not be allowed to drive home. IF YOU ARE HAVING SURGERY AND GOING HOME THE SAME DAY, YOU MUST HAVE AN ADULT TO DRIVE YOU HOME AND BE WITH YOU FOR 24 HOURS. YOU MAY GO HOME BY TAXI OR UBER OR ORTHERWISE, BUT AN ADULT MUST ACCOMPANY YOU HOME AND STAY WITH YOU FOR 24 HOURS.  Name and phone number of your driver:  Special Instructions: N/A              Please read over the following fact sheets you were given: _____________________________________________________________________          [facility] - Preparing for Surgery Before surgery, you can play an important role.  Because skin is not sterile, your skin needs to be as free of germs as possible.  You can reduce the number of germs on your skin by washing with CHG (chlorahexidine gluconate) soap before surgery.  CHG  is an antiseptic cleaner which kills germs and bonds with the skin to continue killing germs even after washing. Please DO NOT  use if you have an allergy to CHG or antibacterial soaps.  If your skin becomes reddened/irritated stop using the CHG and inform your nurse when you arrive at Short Stay. Do not shave (including legs and underarms) for at least 48 hours prior to the first CHG shower.  You may shave your face/neck. Please follow these instructions carefully:  1.  Shower with CHG Soap the night before surgery and the  morning of Surgery.  2.  If you choose to wash your hair, wash your hair first as usual with your  normal  shampoo.  3.  After you shampoo, rinse your hair and body thoroughly to remove the  shampoo.                           4.  Use CHG as you would any other liquid soap.  You can apply chg directly  to the skin and wash                       Gently with a scrungie or clean washcloth.  5.  Apply the CHG Soap to your body ONLY FROM THE NECK DOWN.   Do not use on face/ open                           Wound or open sores. Avoid contact with eyes, ears mouth and genitals (private parts).                       Wash face,  Genitals (private parts) with your normal soap.             6.  Wash thoroughly, paying special attention to the area where your surgery  will be performed.  7.  Thoroughly rinse your body with warm water from the neck down.  8.  DO NOT shower/wash with your normal soap after using and rinsing off  the CHG Soap.                9.  Pat yourself dry with a clean towel.            10.  Wear clean pajamas.            11.  Place clean sheets on your bed the night of your first shower and do not  sleep with pets. Day of Surgery : Do not apply any lotions/deodorants the morning of surgery.  Please wear clean clothes to the hospital/surgery center.  FAILURE TO FOLLOW THESE INSTRUCTIONS MAY RESULT IN THE CANCELLATION OF YOUR SURGERY PATIENT SIGNATURE_________________________________  NURSE  SIGNATURE__________________________________  ________________________________________________________________________

## 2020-12-02 ENCOUNTER — Encounter (HOSPITAL_COMMUNITY)
Admission: RE | Admit: 2020-12-02 | Discharge: 2020-12-02 | Disposition: A | Discharge status: Home / Self Care | Payer: BC Managed Care – PPO | Source: Ambulatory Visit | Attending: Gynecologic Oncology | Admitting: Gynecologic Oncology

## 2020-12-02 ENCOUNTER — Encounter (HOSPITAL_COMMUNITY): Payer: Self-pay

## 2020-12-02 ENCOUNTER — Other Ambulatory Visit: Payer: Self-pay

## 2020-12-02 DIAGNOSIS — Z01812 Encounter for preprocedural laboratory examination: Secondary | ICD-10-CM | POA: Diagnosis present

## 2020-12-02 LAB — CBC
HCT: 43.6 % (ref 36.0–46.0)
Hemoglobin: 14.6 g/dL (ref 12.0–15.0)
MCH: 32.2 pg (ref 26.0–34.0)
MCHC: 33.5 g/dL (ref 30.0–36.0)
MCV: 96 fL (ref 80.0–100.0)
Platelets: 245 10*3/uL (ref 150–400)
RBC: 4.54 MIL/uL (ref 3.87–5.11)
RDW: 12 % (ref 11.5–15.5)
WBC: 5.7 10*3/uL (ref 4.0–10.5)
nRBC: 0 % (ref 0.0–0.2)

## 2020-12-02 LAB — BASIC METABOLIC PANEL
Anion gap: 9 (ref 5–15)
BUN: 19 mg/dL (ref 6–20)
CO2: 23 mmol/L (ref 22–32)
Calcium: 9.2 mg/dL (ref 8.9–10.3)
Chloride: 104 mmol/L (ref 98–111)
Creatinine, Ser: 0.64 mg/dL (ref 0.44–1.00)
GFR, Estimated: >60 mL/min (ref >60)
Glucose, Bld: 99 mg/dL (ref 70–99)
Potassium: 3.7 mmol/L (ref 3.5–5.1)
Sodium: 136 mmol/L (ref 135–145)

## 2020-12-02 NOTE — Progress Notes (Signed)
COVID Vaccine Completed: Yes Date COVID Vaccine completed: 05/19/20 COVID vaccine manufacturer: Pfizer      PCP -Dr. Alma Friendly  Cardiologist -   Chest x-ray -  EKG -  Stress Test -  ECHO -  Cardiac Cath -  Pacemaker/ICD device last checked:  Sleep Study -  CPAP -   Fasting Blood Sugar -  Checks Blood Sugar _____ times a day  Blood Thinner Instructions: Aspirin Instructions: Last Dose:  Anesthesia review:   Patient denies shortness of breath, fever, cough and chest pain at PAT appointment   Patient verbalized understanding of instructions that were given to them at the PAT appointment. Patient was also instructed that they will need to review over the PAT instructions again at home before surgery.

## 2020-12-03 ENCOUNTER — Telehealth: Payer: Self-pay | Admitting: Medical Oncology

## 2020-12-03 NOTE — Telephone Encounter (Signed)
LVM that MRI denied.

## 2020-12-05 ENCOUNTER — Ambulatory Visit (HOSPITAL_COMMUNITY): Payer: BC Managed Care – PPO

## 2020-12-05 ENCOUNTER — Encounter (HOSPITAL_COMMUNITY): Payer: Self-pay

## 2020-12-06 ENCOUNTER — Other Ambulatory Visit: Payer: BC Managed Care – PPO

## 2020-12-06 ENCOUNTER — Other Ambulatory Visit (HOSPITAL_COMMUNITY)
Admission: RE | Admit: 2020-12-06 | Discharge: 2020-12-06 | Disposition: A | Discharge status: Home / Self Care | Payer: BC Managed Care – PPO | Source: Ambulatory Visit | Attending: Gynecologic Oncology | Admitting: Gynecologic Oncology

## 2020-12-06 DIAGNOSIS — Z20822 Contact with and (suspected) exposure to covid-19: Secondary | ICD-10-CM | POA: Diagnosis not present

## 2020-12-06 DIAGNOSIS — Z01812 Encounter for preprocedural laboratory examination: Secondary | ICD-10-CM | POA: Diagnosis present

## 2020-12-06 LAB — SARS CORONAVIRUS 2 (TAT 6-24 HRS): SARS Coronavirus 2: NEGATIVE

## 2020-12-09 ENCOUNTER — Telehealth: Payer: Self-pay

## 2020-12-09 NOTE — Telephone Encounter (Signed)
Ebony Watson states that she understands her written pre-op instructions from 12-02-20.  No questions or concerns noted at this time.

## 2020-12-10 ENCOUNTER — Encounter: Payer: Self-pay | Admitting: Gynecologic Oncology

## 2020-12-10 ENCOUNTER — Encounter (HOSPITAL_COMMUNITY)
Admission: RE | Disposition: A | Discharge status: Home / Self Care | Payer: Self-pay | Source: Home / Self Care | Attending: Gynecologic Oncology

## 2020-12-10 ENCOUNTER — Ambulatory Visit (HOSPITAL_COMMUNITY)
Admission: RE | Admit: 2020-12-10 | Discharge: 2020-12-10 | Disposition: A | Discharge status: Home / Self Care | Payer: BC Managed Care – PPO | Attending: Gynecologic Oncology | Admitting: Gynecologic Oncology

## 2020-12-10 ENCOUNTER — Encounter (HOSPITAL_COMMUNITY): Payer: Self-pay | Admitting: Gynecologic Oncology

## 2020-12-10 ENCOUNTER — Ambulatory Visit (HOSPITAL_COMMUNITY): Payer: BC Managed Care – PPO | Admitting: Certified Registered Nurse Anesthetist

## 2020-12-10 ENCOUNTER — Other Ambulatory Visit: Payer: Self-pay | Admitting: Gynecologic Oncology

## 2020-12-10 DIAGNOSIS — J45909 Unspecified asthma, uncomplicated: Secondary | ICD-10-CM | POA: Insufficient documentation

## 2020-12-10 DIAGNOSIS — Z7951 Long term (current) use of inhaled steroids: Secondary | ICD-10-CM | POA: Diagnosis not present

## 2020-12-10 DIAGNOSIS — E8940 Asymptomatic postprocedural ovarian failure: Secondary | ICD-10-CM

## 2020-12-10 DIAGNOSIS — Z1501 Genetic susceptibility to malignant neoplasm of breast: Secondary | ICD-10-CM

## 2020-12-10 DIAGNOSIS — N8301 Follicular cyst of right ovary: Secondary | ICD-10-CM | POA: Insufficient documentation

## 2020-12-10 DIAGNOSIS — Z148 Genetic carrier of other disease: Secondary | ICD-10-CM

## 2020-12-10 DIAGNOSIS — Z803 Family history of malignant neoplasm of breast: Secondary | ICD-10-CM | POA: Insufficient documentation

## 2020-12-10 DIAGNOSIS — N8312 Corpus luteum cyst of left ovary: Secondary | ICD-10-CM | POA: Diagnosis not present

## 2020-12-10 DIAGNOSIS — N8311 Corpus luteum cyst of right ovary: Secondary | ICD-10-CM | POA: Diagnosis not present

## 2020-12-10 DIAGNOSIS — Z1502 Genetic susceptibility to malignant neoplasm of ovary: Secondary | ICD-10-CM | POA: Insufficient documentation

## 2020-12-10 DIAGNOSIS — Z1509 Genetic susceptibility to other malignant neoplasm: Secondary | ICD-10-CM

## 2020-12-10 DIAGNOSIS — Z8041 Family history of malignant neoplasm of ovary: Secondary | ICD-10-CM | POA: Insufficient documentation

## 2020-12-10 DIAGNOSIS — Z87891 Personal history of nicotine dependence: Secondary | ICD-10-CM | POA: Insufficient documentation

## 2020-12-10 DIAGNOSIS — Z885 Allergy status to narcotic agent status: Secondary | ICD-10-CM | POA: Diagnosis not present

## 2020-12-10 DIAGNOSIS — N8302 Follicular cyst of left ovary: Secondary | ICD-10-CM | POA: Insufficient documentation

## 2020-12-10 DIAGNOSIS — Z8049 Family history of malignant neoplasm of other genital organs: Secondary | ICD-10-CM | POA: Insufficient documentation

## 2020-12-10 DIAGNOSIS — Z4002 Encounter for prophylactic removal of ovary: Secondary | ICD-10-CM

## 2020-12-10 DIAGNOSIS — Z15068 Genetic susceptibility to other disease: Secondary | ICD-10-CM

## 2020-12-10 HISTORY — PX: XI ROBOTIC ASSISTED OOPHORECTOMY: SHX6823

## 2020-12-10 LAB — PREGNANCY, URINE: Preg Test, Ur: NEGATIVE

## 2020-12-10 LAB — ABO/RH: ABO/RH(D): O POS

## 2020-12-10 LAB — TYPE AND SCREEN
ABO/RH(D): O POS
Antibody Screen: NEGATIVE

## 2020-12-10 SURGERY — OOPHORECTOMY, ROBOT-ASSISTED
Anesthesia: General | Laterality: Bilateral

## 2020-12-10 MED ORDER — DEXAMETHASONE SODIUM PHOSPHATE 4 MG/ML IJ SOLN: 4.0000 mg | INTRAMUSCULAR | Status: DC

## 2020-12-10 MED ORDER — CELECOXIB 200 MG PO CAPS: 400.0000 mg | ORAL_CAPSULE | ORAL | Status: AC

## 2020-12-10 MED ORDER — LIDOCAINE 2% (20 MG/ML) 5 ML SYRINGE
INTRAMUSCULAR | Status: DC | PRN
  Administered 2020-12-10: 80 mg via INTRAVENOUS

## 2020-12-10 MED ORDER — LEVONORGESTREL 20 MCG/24HR IU IUD: INTRAUTERINE_SYSTEM | INTRAUTERINE | Status: DC

## 2020-12-10 MED ORDER — HEPARIN SODIUM (PORCINE) 5000 UNIT/ML IJ SOLN
INTRAMUSCULAR | Status: AC
  Administered 2020-12-10: 5000 [IU] via SUBCUTANEOUS
  Filled 2020-12-10: qty 1

## 2020-12-10 MED ORDER — PROMETHAZINE HCL 25 MG/ML IJ SOLN
6.2500 mg | INTRAMUSCULAR | Status: DC | PRN
Start: 2020-12-10 — End: 2020-12-10

## 2020-12-10 MED ORDER — HYDROMORPHONE HCL 1 MG/ML IJ SOLN
INTRAMUSCULAR | Status: AC
  Filled 2020-12-10: qty 1

## 2020-12-10 MED ORDER — CHLORHEXIDINE GLUCONATE 0.12 % MT SOLN
15.0000 mL | Freq: Once | OROMUCOSAL | Status: AC
  Administered 2020-12-10: 15 mL via OROMUCOSAL

## 2020-12-10 MED ORDER — HEPARIN SODIUM (PORCINE) 5000 UNIT/ML IJ SOLN: 5000.0000 [IU] | INTRAMUSCULAR | Status: AC

## 2020-12-10 MED ORDER — LEVONORGESTREL 20 MCG/24HR IU IUD
INTRAUTERINE_SYSTEM | INTRAUTERINE | Status: AC
  Filled 2020-12-10: qty 1

## 2020-12-10 MED ORDER — FENTANYL CITRATE (PF) 100 MCG/2ML IJ SOLN
INTRAMUSCULAR | Status: DC | PRN
  Administered 2020-12-10 (×3): 50 ug via INTRAVENOUS
  Administered 2020-12-10: 100 ug via INTRAVENOUS

## 2020-12-10 MED ORDER — BUPIVACAINE HCL 0.25 % IJ SOLN
INTRAMUSCULAR | Status: DC | PRN
  Administered 2020-12-10: 20 mL

## 2020-12-10 MED ORDER — LIDOCAINE 2% (20 MG/ML) 5 ML SYRINGE
INTRAMUSCULAR | Status: AC
  Filled 2020-12-10: qty 5

## 2020-12-10 MED ORDER — ACETAMINOPHEN 500 MG PO TABS: 1000.0000 mg | ORAL_TABLET | ORAL | Status: AC

## 2020-12-10 MED ORDER — ACETAMINOPHEN 500 MG PO TABS
ORAL_TABLET | ORAL | Status: AC
  Administered 2020-12-10: 1000 mg via ORAL
  Filled 2020-12-10: qty 2

## 2020-12-10 MED ORDER — TRAMADOL HCL 50 MG PO TABS: 50.0000 mg | ORAL_TABLET | Freq: Four times a day (QID) | ORAL | Status: DC | PRN

## 2020-12-10 MED ORDER — MIDAZOLAM HCL 2 MG/2ML IJ SOLN
INTRAMUSCULAR | Status: AC
  Filled 2020-12-10: qty 2

## 2020-12-10 MED ORDER — HYDROMORPHONE HCL 1 MG/ML IJ SOLN
0.2500 mg | INTRAMUSCULAR | Status: DC | PRN
  Administered 2020-12-10 (×4): 0.5 mg via INTRAVENOUS

## 2020-12-10 MED ORDER — ESTRADIOL 0.05 MG/24HR TD PTWK: 0.0500 mg | MEDICATED_PATCH | TRANSDERMAL | 12 refills | Status: DC

## 2020-12-10 MED ORDER — FENTANYL CITRATE (PF) 250 MCG/5ML IJ SOLN
INTRAMUSCULAR | Status: AC
  Filled 2020-12-10: qty 5

## 2020-12-10 MED ORDER — GABAPENTIN 300 MG PO CAPS: 300.0000 mg | ORAL_CAPSULE | ORAL | Status: AC

## 2020-12-10 MED ORDER — ONDANSETRON HCL 4 MG/2ML IJ SOLN
INTRAMUSCULAR | Status: AC
  Filled 2020-12-10: qty 2

## 2020-12-10 MED ORDER — DEXAMETHASONE SODIUM PHOSPHATE 10 MG/ML IJ SOLN
INTRAMUSCULAR | Status: DC | PRN
  Administered 2020-12-10: 10 mg via INTRAVENOUS

## 2020-12-10 MED ORDER — BUPIVACAINE HCL 0.25 % IJ SOLN
INTRAMUSCULAR | Status: AC
  Filled 2020-12-10: qty 1

## 2020-12-10 MED ORDER — SODIUM CHLORIDE 0.9% FLUSH
3.0000 mL | Freq: Two times a day (BID) | INTRAVENOUS | Status: DC
Start: 2020-12-10 — End: 2020-12-10

## 2020-12-10 MED ORDER — DEXAMETHASONE SODIUM PHOSPHATE 10 MG/ML IJ SOLN
INTRAMUSCULAR | Status: AC
  Filled 2020-12-10: qty 1

## 2020-12-10 MED ORDER — ROCURONIUM BROMIDE 10 MG/ML (PF) SYRINGE
PREFILLED_SYRINGE | INTRAVENOUS | Status: DC | PRN
  Administered 2020-12-10: 60 mg via INTRAVENOUS

## 2020-12-10 MED ORDER — SCOPOLAMINE 1 MG/3DAYS TD PT72: 1.0000 | MEDICATED_PATCH | TRANSDERMAL | Status: DC

## 2020-12-10 MED ORDER — PROPOFOL 10 MG/ML IV BOLUS
INTRAVENOUS | Status: DC | PRN
  Administered 2020-12-10: 200 mg via INTRAVENOUS

## 2020-12-10 MED ORDER — MIDAZOLAM HCL 2 MG/2ML IJ SOLN
INTRAMUSCULAR | Status: DC | PRN
  Administered 2020-12-10: 2 mg via INTRAVENOUS

## 2020-12-10 MED ORDER — GABAPENTIN 300 MG PO CAPS
ORAL_CAPSULE | ORAL | Status: AC
  Administered 2020-12-10: 300 mg via ORAL
  Filled 2020-12-10: qty 1

## 2020-12-10 MED ORDER — AMISULPRIDE (ANTIEMETIC) 5 MG/2ML IV SOLN: 10.0000 mg | Freq: Once | INTRAVENOUS | Status: DC | PRN

## 2020-12-10 MED ORDER — ONDANSETRON HCL 4 MG/2ML IJ SOLN
INTRAMUSCULAR | Status: DC | PRN
  Administered 2020-12-10: 4 mg via INTRAVENOUS

## 2020-12-10 MED ORDER — PROPOFOL 10 MG/ML IV BOLUS
INTRAVENOUS | Status: AC
  Filled 2020-12-10: qty 20

## 2020-12-10 MED ORDER — LACTATED RINGERS IV SOLN: INTRAVENOUS | Status: DC

## 2020-12-10 MED ORDER — CELECOXIB 200 MG PO CAPS
ORAL_CAPSULE | ORAL | Status: AC
  Administered 2020-12-10: 400 mg via ORAL
  Filled 2020-12-10: qty 2

## 2020-12-10 MED ORDER — SCOPOLAMINE 1 MG/3DAYS TD PT72
MEDICATED_PATCH | TRANSDERMAL | Status: AC
  Administered 2020-12-10: 1.5 mg via TRANSDERMAL
  Filled 2020-12-10: qty 1

## 2020-12-10 MED ORDER — ORAL CARE MOUTH RINSE: 15.0000 mL | Freq: Once | OROMUCOSAL | Status: AC

## 2020-12-10 MED ORDER — ROCURONIUM BROMIDE 10 MG/ML (PF) SYRINGE
PREFILLED_SYRINGE | INTRAVENOUS | Status: AC
  Filled 2020-12-10: qty 10

## 2020-12-10 MED ORDER — SUGAMMADEX SODIUM 200 MG/2ML IV SOLN
INTRAVENOUS | Status: DC | PRN
  Administered 2020-12-10: 200 mg via INTRAVENOUS

## 2020-12-10 SURGICAL SUPPLY — 63 items
APPLICATOR SURGIFLO ENDO (HEMOSTASIS) IMPLANT
BACTOSHIELD CHG 4% 4OZ (MISCELLANEOUS) ×1
BAG LAPAROSCOPIC 12 15 PORT 16 (BASKET) IMPLANT
BAG RETRIEVAL 12/15 (BASKET)
BLADE SURG SZ10 CARB STEEL (BLADE) IMPLANT
COVER BACK TABLE 60X90IN (DRAPES) ×2 IMPLANT
COVER TIP SHEARS 8 DVNC (MISCELLANEOUS) ×1 IMPLANT
COVER TIP SHEARS 8MM DA VINCI (MISCELLANEOUS) ×1
COVER WAND RF STERILE (DRAPES) IMPLANT
DECANTER SPIKE VIAL GLASS SM (MISCELLANEOUS) IMPLANT
DERMABOND ADVANCED (GAUZE/BANDAGES/DRESSINGS) ×1
DERMABOND ADVANCED .7 DNX12 (GAUZE/BANDAGES/DRESSINGS) ×1 IMPLANT
DRAPE ARM DVNC X/XI (DISPOSABLE) ×4 IMPLANT
DRAPE COLUMN DVNC XI (DISPOSABLE) ×1 IMPLANT
DRAPE DA VINCI XI ARM (DISPOSABLE) ×4
DRAPE DA VINCI XI COLUMN (DISPOSABLE) ×1
DRAPE SHEET LG 3/4 BI-LAMINATE (DRAPES) ×2 IMPLANT
DRAPE SURG IRRIG POUCH 19X23 (DRAPES) ×2 IMPLANT
DRSG OPSITE POSTOP 4X6 (GAUZE/BANDAGES/DRESSINGS) IMPLANT
DRSG OPSITE POSTOP 4X8 (GAUZE/BANDAGES/DRESSINGS) IMPLANT
ELECT PENCIL ROCKER SW 15FT (MISCELLANEOUS) IMPLANT
ELECT REM PT RETURN 15FT ADLT (MISCELLANEOUS) ×2 IMPLANT
GLOVE SURG ENC MOIS LTX SZ6 (GLOVE) ×8 IMPLANT
GLOVE SURG ENC MOIS LTX SZ6.5 (GLOVE) ×4 IMPLANT
GOWN STRL REUS W/ TWL LRG LVL3 (GOWN DISPOSABLE) ×4 IMPLANT
GOWN STRL REUS W/TWL LRG LVL3 (GOWN DISPOSABLE) ×4
HOLDER FOLEY CATH W/STRAP (MISCELLANEOUS) ×2 IMPLANT
IRRIG SUCT STRYKERFLOW 2 WTIP (MISCELLANEOUS) ×2
IRRIGATION SUCT STRKRFLW 2 WTP (MISCELLANEOUS) ×1 IMPLANT
KIT PROCEDURE DA VINCI SI (MISCELLANEOUS)
KIT PROCEDURE DVNC SI (MISCELLANEOUS) IMPLANT
KIT TURNOVER KIT A (KITS) ×2 IMPLANT
MANIPULATOR UTERINE 4.5 ZUMI (MISCELLANEOUS) ×2 IMPLANT
Mirena ×2 IMPLANT
NEEDLE HYPO 21X1.5 SAFETY (NEEDLE) ×2 IMPLANT
NEEDLE SPNL 18GX3.5 QUINCKE PK (NEEDLE) IMPLANT
OBTURATOR OPTICAL STANDARD 8MM (TROCAR) ×1
OBTURATOR OPTICAL STND 8 DVNC (TROCAR) ×1
OBTURATOR OPTICALSTD 8 DVNC (TROCAR) ×1 IMPLANT
PACK ROBOT GYN CUSTOM WL (TRAY / TRAY PROCEDURE) ×2 IMPLANT
PAD POSITIONING PINK XL (MISCELLANEOUS) ×2 IMPLANT
PORT ACCESS TROCAR AIRSEAL 12 (TROCAR) ×1 IMPLANT
PORT ACCESS TROCAR AIRSEAL 5M (TROCAR) ×1
POUCH SPECIMEN RETRIEVAL 10MM (ENDOMECHANICALS) ×2 IMPLANT
SCRUB CHG 4% DYNA-HEX 4OZ (MISCELLANEOUS) ×1 IMPLANT
SEAL CANN UNIV 5-8 DVNC XI (MISCELLANEOUS) ×4 IMPLANT
SEAL XI 5MM-8MM UNIVERSAL (MISCELLANEOUS) ×4
SET TRI-LUMEN FLTR TB AIRSEAL (TUBING) ×2 IMPLANT
SPONGE LAP 18X18 RF (DISPOSABLE) IMPLANT
SURGIFLO W/THROMBIN 8M KIT (HEMOSTASIS) IMPLANT
SUT MNCRL AB 4-0 PS2 18 (SUTURE) IMPLANT
SUT PDS AB 1 TP1 96 (SUTURE) IMPLANT
SUT VIC AB 0 CT1 27 (SUTURE)
SUT VIC AB 0 CT1 27XBRD ANTBC (SUTURE) IMPLANT
SUT VIC AB 4-0 PS2 18 (SUTURE) ×4 IMPLANT
SYR 10ML LL (SYRINGE) IMPLANT
TOWEL OR NON WOVEN STRL DISP B (DISPOSABLE) ×2 IMPLANT
TRAP SPECIMEN MUCUS 40CC (MISCELLANEOUS) ×2 IMPLANT
TRAY FOLEY MTR SLVR 16FR STAT (SET/KITS/TRAYS/PACK) ×2 IMPLANT
TROCAR XCEL NON-BLD 5MMX100MML (ENDOMECHANICALS) IMPLANT
UNDERPAD 30X36 HEAVY ABSORB (UNDERPADS AND DIAPERS) ×2 IMPLANT
WATER STERILE IRR 1000ML POUR (IV SOLUTION) ×2 IMPLANT
YANKAUER SUCT BULB TIP NO VENT (SUCTIONS) IMPLANT

## 2020-12-10 NOTE — Anesthesia Postprocedure Evaluation (Signed)
Anesthesia Post Note  Patient: Ebony Watson  Procedure(s) Performed: XI ROBOTIC ASSISTED BILATERAL  OOPHORECTOMY,  INTRAUTERINE DEVICE (IUD) INSERTION MIRENA (Bilateral )     Patient location during evaluation: PACU Anesthesia Type: General Level of consciousness: awake Pain management: pain level controlled Vital Signs Assessment: post-procedure vital signs reviewed and stable Respiratory status: spontaneous breathing and respiratory function stable Cardiovascular status: stable Postop Assessment: no apparent nausea or vomiting Anesthetic complications: no   No complications documented.  Last Vitals:  Vitals:   12/10/20 1315 12/10/20 1330  BP: 138/79 124/73  Pulse: 66 61  Resp: (!) 9 13  Temp:    SpO2: 100% 99%    Last Pain:  Vitals:   12/10/20 1330  TempSrc:   PainSc: 3                  Candra R Fotios Amos

## 2020-12-10 NOTE — Op Note (Signed)
OPERATIVE NOTE  Pre-operative Diagnosis: BRCA2 mutation  Post-operative Diagnosis: same  Operation: Robotic-assisted laparoscopic bilateral oophorectomy, Mirena IUD insertion  Surgeon: Jeral Pinch MD  Assistant Surgeon: Lahoma Crocker MD (an MD assistant was necessary for tissue manipulation, management of robotic instrumentation, retraction and positioning due to the complexity of the case and hospital policies).   Anesthesia: GET  Urine Output: 250cc  Operative Findings: 8-10cm mobile uterus. Normal upper abdominal survey. Fallopian tubes surgically absent. Normal appearing bilateral ovaries and uterus. No intra-abdominal or pelvic evidence of disease.  Mirena IUD Lot [id number], exp 02/2023  Estimated Blood Loss:  Minimal      Total IV Fluids: see I&O flowsheet         Specimens: pelvic washings, bilateral ovaries         Complications:  None apparent; patient tolerated the procedure well.         Disposition: PACU - hemodynamically stable.  Procedure Details  The patient was seen in the Holding Room. The risks, benefits, complications, treatment options, and expected outcomes were discussed with the patient.  The patient concurred with the proposed plan, giving informed consent.  The site of surgery properly noted/marked. The patient was identified as Ebony Watson. and the procedure verified as a Robotic-assisted bilateral oophorectomy.   After induction of anesthesia, the patient was draped and prepped in the usual sterile manner. Patient was placed in supine position after anesthesia and draped and prepped in the usual sterile manner as follows: Her arms were tucked to her side with all appropriate precautions.  The shoulders were stabilized with padded shoulder blocks applied to the acromium processes.  The patient was placed in the semi-lithotomy position in [place].  The perineum and vagina were prepped with CholoraPrep. The patient was draped after the  CholoraPrep had been allowed to dry for 3 minutes.  A Time Out was held and the above information confirmed.  The urethra was prepped with Betadine. Foley catheter was placed.  OG tube placement was confirmed and to suction.   Next, a 10 mm skin incision was made 1 cm below the subcostal margin in the midclavicular line.  The 5 mm Optiview port and scope was used for direct entry.  Opening pressure was under 10 mm CO2.  The abdomen was insufflated and the findings were noted as above.   At this point and all points during the procedure, the patient's intra-abdominal pressure did not exceed 15 mmHg. Next, an 8 mm skin incision was made inferior the umbilicus and a right and left port were placed about 8 cm lateral to the robot port on the right and left side using prior laparoscopic incisions.  The 5 mm assist trocar was exchanged for a 10-12 mm port. All ports were placed under direct visualization.  The patient was placed in steep Trendelenburg.  The robot was docked in the normal manner.  Pelvic washings were collected.  The right and left peritoneum were opened parallel to the IP ligament to open the retroperitoneal spaces bilaterally. The round ligaments were preserved. The ureter was noted to be on the medial leaf of the broad ligament.  The peritoneum above the ureter was incised and stretched and the infundibulopelvic ligament was skeletonized, cauterized and cut. This was done after assuring a 2cm margin along the IP ligament proximal to the ovary.  The mesosalpinx was cauterized until lateral to the uterus. The utero-ovarian ligaments were skeletonized, cauterized, and transected, freeing the ovaries.  Pedicles were inspected and excellent  hemostasis was achieved.  Bilateral ovaries were placed in an Endocatch bag and removed. At this point in the procedure was completed.  Robotic instruments were removed under direct visulaization.  The robot was undocked. The fascia at the 10-12 mm port was closed  with 0 Vicryl on a UR-5 needle.  The subcuticular tissue was closed with 4-0 Vicryl and the skin was closed with 4-0 Monocryl in a subcuticular manner.  Dermabond was applied.    The vagina was swabbed with  minimal bleeding noted.  A speculum was placed in the vagina and the cervix visualized. A single tooth tenaculum was used to grasp the anterior lip of the cervix. The uterus was sounded to just over 9cm. The Mirena IUD was then inserted, deployed, and the inserter removed. The strings were cut at 2-3cm.    All sponge, lap and needle counts were correct x  3.   The patient was transferred to the recovery room in stable condition.  Jeral Pinch, MD

## 2020-12-10 NOTE — Discharge Instructions (Signed)
12/10/2020  Return to work: 4-6 weeks  Activity: 1. Be up and out of the bed during the day.  Take a nap if needed.  You may walk up steps but be careful and use the hand rail.  Stair climbing will tire you more than you think, you may need to stop part way and rest.   2. No lifting or straining for 6 weeks.  3. No driving until you are off narcotics and feel like you can brake safely. For most people, this takes 1-2 weeks.  4. Shower daily.  Use soap and water on your incision and pat dry; don't rub.   5. No sexual activity and nothing in the vagina for 6 weeks.  Medications:  - Take ibuprofen and tylenol first line for pain control. Take these regularly (every 6 hours) to decrease the build up of pain.  - If necessary, for severe pain not relieved by ibuprofen, take oxycodone.  - While taking oxycodone you should take sennakot every night to reduce the likelihood of constipation. If this causes diarrhea, stop its use.  Diet: 1. Low sodium Heart Healthy Diet is recommended.  2. It is safe to use a laxative if you have difficulty moving your bowels.   Wound Care: 1. Keep clean and dry.  Shower daily.  Reasons to call the Doctor:   Fever - Oral temperature greater than 100.4 degrees Fahrenheit  Foul-smelling vaginal discharge  Difficulty urinating  Nausea and vomiting  Increased pain at the site of the incision that is unrelieved with pain medicine.  Difficulty breathing with or without chest pain  New calf pain especially if only on one side  Sudden, continuing increased vaginal bleeding with or without clots.   Follow-up: 1. See Jeral Pinch in 3 weeks (5/11).  Contacts: For questions or concerns you should contact:  Dr. Jeral Pinch at [phone] After hours and on week-ends call [phone] and ask to speak to the physician on call for Gynecologic Oncology

## 2020-12-10 NOTE — Interval H&P Note (Signed)
History and Physical Interval Note:  12/10/2020 10:41 AM  Ebony Watson  has presented today for surgery, with the diagnosis of BRCA2.  The various methods of treatment have been discussed with the patient and family. After consideration of risks, benefits and other options for treatment, the patient has consented to  Procedure(s): XI ROBOTIC ASSISTED BILATERAL  OOPHORECTOMY, POSSBILE BILATERAL SALPINGO-OOPHORECTOMY POSSIBLE STAGING INTRAUTERINE DEVICE (IUD) INSERTION MIRENA (Bilateral) as a surgical intervention.  The patient's history has been reviewed, patient examined, no change in status, stable for surgery.  I have reviewed the patient's chart and labs.  Questions were answered to the patient's satisfaction.     Lafonda Mosses

## 2020-12-10 NOTE — Transfer of Care (Signed)
Immediate Anesthesia Transfer of Care Note  Patient: Ebony Watson  Procedure(s) Performed: XI ROBOTIC ASSISTED BILATERAL  OOPHORECTOMY,  INTRAUTERINE DEVICE (IUD) INSERTION MIRENA (Bilateral )  Patient Location: PACU  Anesthesia Type:General  Level of Consciousness: awake, alert  and oriented  Airway & Oxygen Therapy: Patient Spontanous Breathing and Patient connected to face mask oxygen  Post-op Assessment: Report given to RN and Post -op Vital signs reviewed and stable  Post vital signs: Reviewed and stable  Last Vitals:  Vitals Value Taken Time  BP 115/69 12/10/20 1252  Temp    Pulse 68 12/10/20 1255  Resp 10 12/10/20 1255  SpO2 99 % 12/10/20 1255  Vitals shown include unvalidated device data.  Last Pain:  Vitals:   12/10/20 0935  TempSrc:   PainSc: 0-No pain         Complications: No complications documented.

## 2020-12-10 NOTE — Anesthesia Preprocedure Evaluation (Addendum)
Anesthesia Evaluation  Patient identified by MRN, date of birth, ID band  Reviewed: Allergy & Precautions, NPO status , Patient's Chart, lab work & pertinent test results  Airway Mallampati: II  TM Distance: >3 FB Neck ROM: Full    Dental no notable dental hx.    Pulmonary asthma , former smoker,    Pulmonary exam normal breath sounds clear to auscultation       Cardiovascular Exercise Tolerance: Good negative cardio ROS Normal cardiovascular exam Rhythm:Regular Rate:Normal     Neuro/Psych PSYCHIATRIC DISORDERS Anxiety Depression negative neurological ROS     GI/Hepatic negative GI ROS, Neg liver ROS,   Endo/Other  negative endocrine ROS  Renal/GU negative Renal ROS  negative genitourinary   Musculoskeletal negative musculoskeletal ROS (+)   Abdominal   Peds negative pediatric ROS (+)  Hematology negative hematology ROS (+)   Anesthesia Other Findings BRCA positive  Reproductive/Obstetrics negative OB ROS                            Anesthesia Physical Anesthesia Plan  ASA: II  Anesthesia Plan: General   Post-op Pain Management:    Induction: Intravenous  PONV Risk Score and Plan: 3 and Scopolamine patch - Pre-op, Treatment may vary due to age or medical condition, Midazolam, Dexamethasone and Ondansetron  Airway Management Planned: Oral ETT  Additional Equipment: None  Intra-op Plan:   Post-operative Plan: Extubation in OR  Informed Consent: I have reviewed the patients History and Physical, chart, labs and discussed the procedure including the risks, benefits and alternatives for the proposed anesthesia with the patient or authorized representative who has indicated his/her understanding and acceptance.       Plan Discussed with: Anesthesiologist and CRNA  Anesthesia Plan Comments:        Anesthesia Quick Evaluation

## 2020-12-10 NOTE — Anesthesia Procedure Notes (Signed)
Procedure Name: Intubation Date/Time: 12/10/2020 11:39 AM Performed by: Genelle Bal, CRNA Pre-anesthesia Checklist: Patient identified, Emergency Drugs available, Suction available and Patient being monitored Patient Re-evaluated:Patient Re-evaluated prior to induction Oxygen Delivery Method: Circle system utilized Preoxygenation: Pre-oxygenation with 100% oxygen Induction Type: IV induction Ventilation: Mask ventilation without difficulty Laryngoscope Size: Miller and 2 Grade View: Grade I Tube type: Oral Tube size: 7.0 mm Number of attempts: 1 Airway Equipment and Method: Stylet and Oral airway Placement Confirmation: ETT inserted through vocal cords under direct vision,  positive ETCO2 and breath sounds checked- equal and bilateral Secured at: 21 cm Tube secured with: Tape Dental Injury: Teeth and Oropharynx as per pre-operative assessment

## 2020-12-11 ENCOUNTER — Encounter (HOSPITAL_COMMUNITY): Payer: Self-pay | Admitting: Gynecologic Oncology

## 2020-12-11 ENCOUNTER — Telehealth: Payer: Self-pay

## 2020-12-11 LAB — SURGICAL PATHOLOGY

## 2020-12-11 NOTE — Telephone Encounter (Signed)
Ebony Watson states that she is eating,drinking, and urinating well. Passing gas. Afebrile. Incisions D&I.  Suggested she increase the senokot-s to 2 tabs bid until first BM. Take each dose with 8 oz of water. Pain controlled with current pain medications. Pt to pick up estradiol patches this afternoon. Pt aware of post op appointments as well as the office number [phone] and after hours number [phone] to call if she has any questions or concerns

## 2020-12-12 LAB — CYTOLOGY - NON PAP

## 2020-12-13 ENCOUNTER — Other Ambulatory Visit: Payer: Self-pay | Admitting: Gynecologic Oncology

## 2020-12-13 DIAGNOSIS — B3731 Acute candidiasis of vulva and vagina: Secondary | ICD-10-CM

## 2020-12-13 DIAGNOSIS — B373 Candidiasis of vulva and vagina: Secondary | ICD-10-CM

## 2020-12-13 MED ORDER — FLUCONAZOLE 150 MG PO TABS: 150.0000 mg | ORAL_TABLET | Freq: Once | ORAL | 0 refills | Status: AC

## 2020-12-13 NOTE — Progress Notes (Signed)
See mychart message. Patient requesting diflucan for vaginal candidiasis.

## 2020-12-22 DIAGNOSIS — D0512 Intraductal carcinoma in situ of left breast: Secondary | ICD-10-CM

## 2020-12-22 HISTORY — DX: Intraductal carcinoma in situ of left breast: D05.12

## 2020-12-25 ENCOUNTER — Other Ambulatory Visit: Payer: Self-pay

## 2020-12-25 ENCOUNTER — Ambulatory Visit (HOSPITAL_COMMUNITY)
Admission: RE | Admit: 2020-12-25 | Discharge: 2020-12-25 | Disposition: A | Discharge status: Home / Self Care | Payer: BC Managed Care – PPO | Source: Ambulatory Visit | Attending: Hematology and Oncology | Admitting: Hematology and Oncology

## 2020-12-25 DIAGNOSIS — Z1509 Genetic susceptibility to other malignant neoplasm: Secondary | ICD-10-CM | POA: Insufficient documentation

## 2020-12-25 DIAGNOSIS — Z1501 Genetic susceptibility to malignant neoplasm of breast: Secondary | ICD-10-CM | POA: Insufficient documentation

## 2020-12-25 MED ORDER — GADOBUTROL 1 MMOL/ML IV SOLN
8.0000 mL | Freq: Once | INTRAVENOUS | Status: AC | PRN
  Administered 2020-12-25: 8 mL via INTRAVENOUS

## 2020-12-27 ENCOUNTER — Other Ambulatory Visit: Payer: Self-pay | Admitting: Hematology and Oncology

## 2020-12-27 NOTE — Progress Notes (Signed)
Called breast with Mri results. Sent message to [facility] to help schedule breast biopsies Patient was asked to call us back if she didn't hear from the primary team.  Ebony Watson

## 2020-12-30 ENCOUNTER — Other Ambulatory Visit: Payer: Self-pay | Admitting: Hematology and Oncology

## 2020-12-30 ENCOUNTER — Telehealth: Payer: Self-pay

## 2020-12-30 ENCOUNTER — Other Ambulatory Visit: Payer: Self-pay | Admitting: *Deleted

## 2020-12-30 DIAGNOSIS — Z1501 Genetic susceptibility to malignant neoplasm of breast: Secondary | ICD-10-CM

## 2020-12-30 NOTE — Telephone Encounter (Signed)
Patient called and informed that she would be receiving a call to have a MRI-guided core biopsy of her breasts done. Patient understanding of this. Patient had no further questions and knows to expect a call to get procedure scheduled.

## 2020-12-31 ENCOUNTER — Other Ambulatory Visit: Payer: Self-pay | Admitting: Hematology and Oncology

## 2020-12-31 ENCOUNTER — Encounter: Payer: Self-pay | Admitting: Gynecologic Oncology

## 2020-12-31 DIAGNOSIS — Z1501 Genetic susceptibility to malignant neoplasm of breast: Secondary | ICD-10-CM

## 2020-12-31 DIAGNOSIS — Z1509 Genetic susceptibility to other malignant neoplasm: Secondary | ICD-10-CM

## 2021-01-01 ENCOUNTER — Encounter: Payer: Self-pay | Admitting: Gynecologic Oncology

## 2021-01-01 ENCOUNTER — Other Ambulatory Visit: Payer: Self-pay

## 2021-01-01 ENCOUNTER — Inpatient Hospital Stay: Payer: BC Managed Care – PPO | Attending: Gynecologic Oncology | Admitting: Gynecologic Oncology

## 2021-01-01 VITALS — BP 143/84 | HR 72 | Temp 98.6°F | Resp 20 | Ht 67.0 in | Wt 193.0 lb

## 2021-01-01 DIAGNOSIS — Z803 Family history of malignant neoplasm of breast: Secondary | ICD-10-CM

## 2021-01-01 DIAGNOSIS — Z148 Genetic carrier of other disease: Secondary | ICD-10-CM | POA: Insufficient documentation

## 2021-01-01 DIAGNOSIS — Z8041 Family history of malignant neoplasm of ovary: Secondary | ICD-10-CM

## 2021-01-01 DIAGNOSIS — Z90722 Acquired absence of ovaries, bilateral: Secondary | ICD-10-CM | POA: Insufficient documentation

## 2021-01-01 DIAGNOSIS — Z1509 Genetic susceptibility to other malignant neoplasm: Secondary | ICD-10-CM

## 2021-01-01 DIAGNOSIS — Z1501 Genetic susceptibility to malignant neoplasm of breast: Secondary | ICD-10-CM

## 2021-01-01 DIAGNOSIS — Z1502 Genetic susceptibility to malignant neoplasm of ovary: Secondary | ICD-10-CM | POA: Insufficient documentation

## 2021-01-01 NOTE — Progress Notes (Signed)
Gynecologic Oncology Return Clinic Visit  01/01/21  Reason for Visit: follow-up after surgery  Treatment History: 09/10/20: [facility] BS for sterilization, no malignancy 2/3: [facility] genetic testing - pathogenic mutation in BRCA2 4/4: consultation with high risk breast clinic 4/19: robotic BSO, Mirena IUD insertion 4/19: started on estrogen replacement 5/4: breast MRI  Interval History: Patient presents today for follow-up after recent surgery.  She notes overall doing well.  She denies any significant pain related to her incisions are within her abdomen.  She endorses a regular diet without nausea or emesis.  She reports normal bowel and bladder function.  She has been having ongoing vaginal bleeding since surgery.  She is scheduled for bilateral breast biopsies tomorrow given MRI findings.  Past Medical/Surgical History: Past Medical History:  Diagnosis Date  . Anxiety   . Asthma   . BRCA2 gene mutation positive in female   . Depression   . Hyperlipidemia     Past Surgical History:  Procedure Laterality Date  . TUBAL LIGATION    . WISDOM TOOTH EXTRACTION    . XI ROBOTIC ASSISTED OOPHORECTOMY Bilateral 12/10/2020   Procedure: XI ROBOTIC ASSISTED BILATERAL  OOPHORECTOMY,  INTRAUTERINE DEVICE (IUD) INSERTION MIRENA;  Surgeon: Lafonda Mosses, MD;  Location: [facility];  Service: Gynecology;  Laterality: Bilateral;    Family History  Problem Relation Age of Onset  . Drug abuse Mother   . Hypertension Mother   . Hyperlipidemia Mother   . Heart disease Father   . Prostate cancer Father   . Drug abuse Sister   . Breast cancer Maternal Aunt   . Ovarian cancer Maternal Aunt   . BRCA 1/2 Maternal Aunt   . Cervical cancer Sister   . Drug abuse Sister     Social History   Socioeconomic History  . Marital status: Single    Spouse name: Not on file  . Number of children: Not on file  . Years of education: Not on file  . Highest education level: Not on file  Occupational History   . Occupation: infection control  Tobacco Use  . Smoking status: Former Smoker    Packs/day: 0.25    Years: 4.00    Pack years: 1.00    Types: Cigarettes    Quit date: 2007    Years since quitting: 15.3  . Smokeless tobacco: Never Used  Vaping Use  . Vaping Use: Never used  Substance and Sexual Activity  . Alcohol use: Yes    Comment: socially  . Drug use: Never  . Sexual activity: Yes    Birth control/protection: Surgical  Other Topics Concern  . Not on file  Social History Narrative  . Not on file   Social Determinants of Health   Financial Resource Strain: Not on file  Food Insecurity: Not on file  Transportation Needs: Not on file  Physical Activity: Not on file  Stress: Not on file  Social Connections: Not on file    Current Medications:  Current Outpatient Medications:  .  ADVAIR DISKUS 250-50 MCG/DOSE AEPB, Inhale 1 puff into the lungs 2 (two) times daily., Disp: , Rfl:  .  albuterol (VENTOLIN HFA) 108 (90 Base) MCG/ACT inhaler, Inhale 2 puffs into the lungs every 4 (four) hours as needed (Asthma)., Disp: , Rfl:  .  Cholecalciferol 125 MCG (5000 UT) capsule, Take 5,000 Units by mouth daily., Disp: , Rfl:  .  estradiol (CLIMARA - DOSED IN MG/24 HR) 0.05 mg/24hr patch, Place 1 patch (0.05 mg total) onto  the skin once a week., Disp: 4 patch, Rfl: 12 .  loratadine (CLARITIN) 10 MG tablet, Take 10 mg by mouth daily., Disp: , Rfl:  .  Multiple Vitamin (MULTI-VITAMIN DAILY PO), Take 1 tablet by mouth daily., Disp: , Rfl:  .  Omega-3 Fatty Acids (FISH OIL PO), Take 1 capsule by mouth daily., Disp: , Rfl:  .  Probiotic Product (PROBIOTIC DAILY PO), Take 1 tablet by mouth daily., Disp: , Rfl:   Review of Systems: Denies appetite changes, fevers, chills, fatigue, unexplained weight changes. Denies hearing loss, neck lumps or masses, mouth sores, ringing in ears or voice changes. Denies cough or wheezing.  Denies shortness of breath. Denies chest pain or palpitations.  Denies leg swelling. Denies abdominal distention, pain, blood in stools, constipation, diarrhea, nausea, vomiting, or early satiety. Denies pain with intercourse, dysuria, frequency, hematuria or incontinence. Denies hot flashes, pelvic pain or vaginal discharge.   Denies joint pain, back pain or muscle pain/cramps. Denies itching, rash, or wounds. Denies dizziness, headaches, numbness or seizures. Denies swollen lymph nodes or glands, denies easy bruising or bleeding. Denies anxiety, depression, confusion, or decreased concentration.  Physical Exam: BP (!) 143/84 (BP Location: Left Arm, Patient Position: Sitting)   Pulse 72   Temp 98.6 F (37 C) (Oral)   Resp 20   Ht $R'5\' 7"'WJ$  (1.702 m)   Wt 193 lb (87.5 kg)   SpO2 99% Comment: RA  BMI 30.23 kg/m  General: Alert, oriented, no acute distress. HEENT: Normocephalic, atraumatic, sclera anicteric. Chest: Unlabored breathing on room air. Abdomen: soft, nontender.  Normoactive bowel sounds.  No masses or hepatosplenomegaly appreciated.  Well-healing laparoscopic incisions. Extremities: Grossly normal range of motion.  Warm, well perfused.  No edema bilaterally. Skin: No rashes or lesions noted.  Laboratory & Radiologic Studies: A. OVARIES, BILATERAL, OOPHORECTOMY:  - Benign cystic follicles and corpus lutea.   Cytology of pelvic washings - reactive mesothelial cells  Assessment & Plan: Kevin Mario is a 40 y.o. woman with a BRCA2 mutation who is several weeks status post robotic bilateral oophorectomy for risk reduction.  Patient is meeting postoperative milestones.  She has started going back to the gym and doing some light weightlifting.  We discussed that she is released to regular activity at the 4-week mark.  She has had follow-up with the high risk breast clinic and has undergone mammography and is scheduled for breast biopsies tomorrow and Friday.  In terms of her Mirena IUD, we discussed that some abnormal and  irregular bleeding can be very normal after insertion.  I suspect that when she has sloughed the lining of her uterus, that her bleeding will stop completely.  I have asked her to keep me posted over the next month or 2 regarding her bleeding.  We discussed that there is Limited evidence regarding utility of CA-125 or imaging surveillance after risk-reducing BSO. This is partially because the risk of primary peritoneal cancer is so low.  At this time, I do not recommend routine surveillance with either CA-125 or imaging.  I would recommend a visit with a gynecologist or her primary care doctor yearly. We discussed signs and symptoms that would be concerning and that should prompt a phone call to her gynecologist or PCP.  26 minutes of total time was spent for this patient encounter, including preparation, face-to-face counseling with the patient and coordination of care, and documentation of the encounter.  Jeral Pinch, MD  Division of Gynecologic Oncology  Department of Obstetrics and Gynecology  [employer] [facility] [employer] [facility]

## 2021-01-01 NOTE — Patient Instructions (Signed)
It was good to see you today.  You are healing very well from surgery.  Keep me posted over the next few months if you feel like your bleeding is worsening.  I expect it to decrease and ultimately stop.

## 2021-01-02 ENCOUNTER — Ambulatory Visit
Admission: RE | Admit: 2021-01-02 | Discharge: 2021-01-02 | Disposition: A | Discharge status: Home / Self Care | Payer: BC Managed Care – PPO | Source: Ambulatory Visit | Attending: Hematology and Oncology | Admitting: Hematology and Oncology

## 2021-01-02 ENCOUNTER — Other Ambulatory Visit (HOSPITAL_COMMUNITY): Payer: Self-pay | Admitting: Diagnostic Radiology

## 2021-01-02 DIAGNOSIS — Z1509 Genetic susceptibility to other malignant neoplasm: Secondary | ICD-10-CM

## 2021-01-02 DIAGNOSIS — Z1501 Genetic susceptibility to malignant neoplasm of breast: Secondary | ICD-10-CM

## 2021-01-02 MED ORDER — GADOBUTROL 1 MMOL/ML IV SOLN
8.0000 mL | Freq: Once | INTRAVENOUS | Status: DC | PRN
Start: 2021-01-02 — End: 2021-01-03

## 2021-01-03 ENCOUNTER — Ambulatory Visit: Payer: BC Managed Care – PPO

## 2021-01-03 ENCOUNTER — Ambulatory Visit
Admission: RE | Admit: 2021-01-03 | Discharge: 2021-01-03 | Disposition: A | Discharge status: Home / Self Care | Payer: BC Managed Care – PPO | Source: Ambulatory Visit | Attending: Hematology and Oncology | Admitting: Hematology and Oncology

## 2021-01-03 ENCOUNTER — Other Ambulatory Visit: Payer: Self-pay | Admitting: Hematology and Oncology

## 2021-01-03 ENCOUNTER — Other Ambulatory Visit: Payer: Self-pay

## 2021-01-03 DIAGNOSIS — Z1501 Genetic susceptibility to malignant neoplasm of breast: Secondary | ICD-10-CM

## 2021-01-03 DIAGNOSIS — Z1509 Genetic susceptibility to other malignant neoplasm: Secondary | ICD-10-CM

## 2021-01-03 MED ORDER — GADOBUTROL 1 MMOL/ML IV SOLN
10.0000 mL | Freq: Once | INTRAVENOUS | Status: AC | PRN
  Administered 2021-01-03: 10 mL via INTRAVENOUS

## 2021-01-06 ENCOUNTER — Other Ambulatory Visit: Payer: Self-pay | Admitting: Hematology and Oncology

## 2021-01-06 DIAGNOSIS — Z1509 Genetic susceptibility to other malignant neoplasm: Secondary | ICD-10-CM

## 2021-01-06 DIAGNOSIS — Z1501 Genetic susceptibility to malignant neoplasm of breast: Secondary | ICD-10-CM

## 2021-01-07 ENCOUNTER — Telehealth: Payer: Self-pay | Admitting: Hematology and Oncology

## 2021-01-07 NOTE — Telephone Encounter (Signed)
Scheduled appt per 5/17 sch msg. Called pt, no answer. Left msg with appt date and time.

## 2021-01-08 ENCOUNTER — Ambulatory Visit
Admission: RE | Admit: 2021-01-08 | Discharge: 2021-01-08 | Disposition: A | Discharge status: Home / Self Care | Payer: BC Managed Care – PPO | Source: Ambulatory Visit | Attending: Hematology and Oncology | Admitting: Hematology and Oncology

## 2021-01-08 ENCOUNTER — Other Ambulatory Visit: Payer: Self-pay

## 2021-01-08 DIAGNOSIS — Z1501 Genetic susceptibility to malignant neoplasm of breast: Secondary | ICD-10-CM

## 2021-01-08 DIAGNOSIS — Z1509 Genetic susceptibility to other malignant neoplasm: Secondary | ICD-10-CM

## 2021-01-08 MED ORDER — GADOBUTROL 1 MMOL/ML IV SOLN
6.0000 mL | Freq: Once | INTRAVENOUS | Status: AC | PRN
  Administered 2021-01-08: 6 mL via INTRAVENOUS

## 2021-01-09 ENCOUNTER — Encounter: Payer: Self-pay | Admitting: Gynecologic Oncology

## 2021-01-09 ENCOUNTER — Other Ambulatory Visit: Payer: Self-pay | Admitting: Gynecologic Oncology

## 2021-01-09 DIAGNOSIS — E8940 Asymptomatic postprocedural ovarian failure: Secondary | ICD-10-CM

## 2021-01-09 DIAGNOSIS — Z1501 Genetic susceptibility to malignant neoplasm of breast: Secondary | ICD-10-CM

## 2021-01-09 MED ORDER — ESTRADIOL 0.5 MG PO TABS: 0.5000 mg | ORAL_TABLET | Freq: Every day | ORAL | 6 refills | Status: DC

## 2021-01-09 NOTE — Telephone Encounter (Signed)
Told Ms Diener that [facility], said that she can prescribe a Vivelle-dot which is a smaller patch and is changed twice a week or she can prescribe a pill. Ms Verbeke states that she would prefer a pill as the patch gets caught in her clothes and she does not know it is off. She also works out at [employer] and the perspiration decreases the adhesiveness of the patch. Will notify Joylene John, NP of patient's choice for oral tablet.

## 2021-01-10 ENCOUNTER — Telehealth: Payer: Self-pay

## 2021-01-10 ENCOUNTER — Other Ambulatory Visit: Payer: Self-pay

## 2021-01-10 ENCOUNTER — Inpatient Hospital Stay (HOSPITAL_BASED_OUTPATIENT_CLINIC_OR_DEPARTMENT_OTHER): Payer: BC Managed Care – PPO | Admitting: Hematology and Oncology

## 2021-01-10 ENCOUNTER — Encounter: Payer: Self-pay | Admitting: Hematology and Oncology

## 2021-01-10 VITALS — BP 129/61 | HR 73 | Temp 97.8°F | Resp 18 | Wt 188.3 lb

## 2021-01-10 DIAGNOSIS — Z1501 Genetic susceptibility to malignant neoplasm of breast: Secondary | ICD-10-CM

## 2021-01-10 DIAGNOSIS — D0512 Intraductal carcinoma in situ of left breast: Secondary | ICD-10-CM | POA: Diagnosis not present

## 2021-01-10 DIAGNOSIS — Z90722 Acquired absence of ovaries, bilateral: Secondary | ICD-10-CM | POA: Diagnosis not present

## 2021-01-10 DIAGNOSIS — Z1502 Genetic susceptibility to malignant neoplasm of ovary: Secondary | ICD-10-CM | POA: Diagnosis present

## 2021-01-10 DIAGNOSIS — Z148 Genetic carrier of other disease: Secondary | ICD-10-CM | POA: Diagnosis present

## 2021-01-10 DIAGNOSIS — Z1509 Genetic susceptibility to other malignant neoplasm: Secondary | ICD-10-CM

## 2021-01-10 NOTE — Progress Notes (Signed)
[facility] FOLLOW UP NOTE  Patient Care Team: Alma Friendly, MD as PCP - General (Internal Medicine)  CHIEF COMPLAINTS/PURPOSE OF CONSULTATION:  Abnormal breast biopsy,  ASSESSMENT & PLAN:   This is a very pleasant 40 year old female patient with BRCA2 mutation who was seen in the high-risk breast cancer clinic, had a breast MRI recently which showed abnormal areas in the right breast and left breast s.p biopsy here to discuss results. 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  Rest of the breast biopsies did not show any evidence of malignancy.  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  She is here to discuss the biopsy results.  We have again discussed about considering bilateral mastectomy given her BRCA2 mutation as well as new evidence of DCIS.  She is considering surgery at this time and has a follow-up with the breast surgeon on Tuesday.  If she does indeed have a bilateral mastectomy, there is no additional role for radiation or antiestrogen therapy.  We will still follow-up with her in the breast cancer clinic on an every 76-monthbasis for history, physical examination and to review any other recommendations. She does not have any evidence of passive motor symptoms at this time from her recent oophorectomy hence we will not start her on any medication.  She knows to refrain from any estrogen supplementation at this time. We have also discussed about increased risk of pancreatic cancer and the lack of proper screening since she does not have any first-degree relatives with pancreatic cancer, we have discussed symptoms of pancreatic cancer and to [place] any new or worsening concerns. She should continue annual follow-up with her dermatologist for melanoma checkup.  She expressed understanding of all recommendations and will return to clinic in about 6  months.  HISTORY OF PRESENTING ILLNESS:  Ebony Oliphant89y.o. 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   INTERIM HISTORY  Ms. JKizziis here for follow-up after her recent MRI guided breast biopsies.  She is doing quite well.  She has heard about the pathology results.  She says that she wants to proceed with bilateral mastectomies so she does not have to worry about these imaging findings and biopsies in the future.  She denies any new health problems.  She has been doing quite well since the oophorectomy.  She does not have any vasomotor symptoms at this time.  Breast biopsies are healing well. Rest of the pertinent review of systems reviewed and negative.  REVIEW OF SYSTEMS:   Constitutional: Denies fevers, chills or abnormal night sweats Eyes: Denies blurriness of vision, double vision or watery eyes Ears, nose, mouth, throat, and face: Denies mucositis or sore throat Respiratory: Denies cough, dyspnea or wheezes Cardiovascular: Denies palpitation, chest discomfort or lower extremity swelling Gastrointestinal:  Denies nausea, heartburn or change in bowel habits Skin: Denies abnormal skin rashes Lymphatics: Denies new lymphadenopathy or easy bruising Neurological:Denies numbness, tingling or new weaknesses Behavioral/Psych: Mood is stable, no new changes  All other systems were reviewed with the patient and are negative.   MEDICAL HISTORY:  Past Medical History:  Diagnosis Date  . Anxiety   . Asthma   . BRCA2 gene mutation positive in female   . Depression   . Hyperlipidemia     SURGICAL HISTORY: Past Surgical History:  Procedure Laterality Date  . TUBAL LIGATION    . WISDOM TOOTH EXTRACTION    . XI ROBOTIC ASSISTED OOPHORECTOMY Bilateral 12/10/2020   Procedure: XI ROBOTIC ASSISTED BILATERAL  OOPHORECTOMY,  INTRAUTERINE DEVICE (IUD) INSERTION MIRENA;  Surgeon: Lafonda Mosses, MD;  Location: [facility];   Service: Gynecology;  Laterality: Bilateral;    SOCIAL HISTORY: Social History   Socioeconomic History  . Marital status: Single    Spouse name: Not on file  . Number of children: Not on file  . Years of education: Not on file  . Highest education level: Not on file  Occupational History  . Occupation: infection control  Tobacco Use  . Smoking status: Former Smoker    Packs/day: 0.25    Years: 4.00    Pack years: 1.00    Types: Cigarettes    Quit date: 2007    Years since quitting: 15.3  . Smokeless tobacco: Never Used  Vaping Use  . Vaping Use: Never used  Substance and Sexual Activity  . Alcohol use: Yes    Comment: socially  . Drug use: Never  . Sexual activity: Yes    Birth control/protection: Surgical  Other Topics Concern  . Not on file  Social History Narrative  . Not on file   Social Determinants of Health   Financial Resource Strain: Not on file  Food Insecurity: Not on file  Transportation Needs: Not on file  Physical Activity: Not on file  Stress: Not on file  Social Connections: Not on file  Intimate Partner Violence: Not on file    FAMILY HISTORY: Family History  Problem Relation Age of Onset  . Drug abuse Mother   . Hypertension Mother   . Hyperlipidemia Mother   . Heart disease Father   . Prostate cancer Father   . Drug abuse Sister   . Breast cancer Maternal Aunt   . Ovarian cancer Maternal Aunt   . BRCA 1/2 Maternal Aunt   . Cervical cancer Sister   . Drug abuse Sister     ALLERGIES:  is allergic to hydrocodone-acetaminophen and oxycodone-acetaminophen.  MEDICATIONS:  Current Outpatient Medications  Medication Sig Dispense Refill  . ADVAIR DISKUS 250-50 MCG/DOSE AEPB Inhale 1 puff into the lungs 2 (two) times daily.    Marland Kitchen albuterol (VENTOLIN HFA) 108 (90 Base) MCG/ACT inhaler Inhale 2 puffs into the lungs every 4 (four) hours as needed (Asthma).    . Cholecalciferol 125 MCG (5000 UT) capsule Take 5,000 Units by mouth daily.    Marland Kitchen  estradiol (ESTRACE) 0.5 MG tablet Take 1 tablet (0.5 mg total) by mouth daily. 30 tablet 6  . loratadine (CLARITIN) 10 MG tablet Take 10 mg by mouth daily.    . Multiple Vitamin (MULTI-VITAMIN DAILY PO) Take 1 tablet by mouth daily.    . Omega-3 Fatty Acids (FISH OIL PO) Take 1 capsule by mouth daily.    . Probiotic Product (PROBIOTIC DAILY PO) Take 1 tablet by mouth daily.     No current facility-administered medications for this visit.      PHYSICAL EXAMINATION: ECOG PERFORMANCE STATUS: 0 - Asymptomatic  Vitals:   01/10/21 1246  BP: 129/61  Pulse: 73  Resp: 18  Temp: 97.8 F (36.6 C)  SpO2: 96%   Filed Weights   01/10/21 1246  Weight: 188 lb 4.8 oz (85.4 kg)    PE deferred in lieu of counseling  LABORATORY DATA:  I have reviewed the data as listed Lab Results  Component Value Date  WBC 5.7 12/02/2020   HGB 14.6 12/02/2020   HCT 43.6 12/02/2020   MCV 96.0 12/02/2020   PLT 245 12/02/2020     Chemistry      Component Value Date/Time   NA 136 12/02/2020 0933   K 3.7 12/02/2020 0933   CL 104 12/02/2020 0933   CO2 23 12/02/2020 0933   BUN 19 12/02/2020 0933   CREATININE 0.64 12/02/2020 0933      Component Value Date/Time   CALCIUM 9.2 12/02/2020 0933       RADIOGRAPHIC STUDIES: I have personally reviewed the radiological images as listed and agreed with the findings in the report. MR BREAST RIGHT W WO CONTRAST INC CAD  Result Date: 01/03/2021 CLINICAL DATA:  40 year old female with BRCA2 gene positive. Recent screening MRI showed 2 areas of abnormal enhancement in the right breast for which biopsy was recommended. Patient underwent 2 MR guided core biopsies of the left breast on 01/02/2021. LABS:  None obtained at the time of imaging. EXAM: MR OF THE RIGHT BREAST WITH AND WITHOUT CONTRAST TECHNIQUE: Multiplanar, multisequence MR images of the right breast were obtained prior to and following the intravenous administration of 10 ml of Gadavist.  Three-dimensional MR images were rendered by post-processing of the original MR data on an independent workstation. The three-dimensional MR images were interpreted, and findings are reported in the following complete MRI report for this study. Three dimensional images were evaluated at the independent DynaCad workstation COMPARISON:  Previous exam(s). FINDINGS: Breast composition: b. Scattered fibroglandular tissue. Background parenchymal enhancement: Moderate. Patient presents for MR guided core biopsies of the right breast. MRI identified the 2 areas of abnormal enhancement in the right breast and 2 needles were placed into the right breast. The patient became vasovagal and nauseated and could not tolerate the biopsies. The needles were removed and pressure was applied to the right breast. The patient was cared for and left the facility in good health. The patient will be rescheduled for the right MR guided core biopsies. IMPRESSION: MR guided core biopsies of the right breast could not be performed secondary to the patient becoming nauseated and vasovagal. RECOMMENDATION: MR guided core biopsies of the 2 areas of abnormal enhancement in the right breast is recommended. BI-RADS CATEGORY  4: Suspicious. Electronically Signed   By: Lillia Mountain M.D.   On: 01/03/2021 10:55   MR BREAST BILATERAL W WO CONTRAST INC CAD  Result Date: 12/26/2020 CLINICAL DATA:  High risk breast cancer screening MRI. BRCA2 gene positive. LABS:  Does not apply EXAM: BILATERAL BREAST MRI WITH AND WITHOUT CONTRAST TECHNIQUE: Multiplanar, multisequence MR images of both breasts were obtained prior to and following the intravenous administration of 8 ml of Gadavist Three-dimensional MR images were rendered by post-processing of the original MR data on an independent workstation. The three-dimensional MR images were interpreted, and findings are reported in the following complete MRI report for this study. Three dimensional images were  evaluated at the independent interpreting workstation using the DynaCAD thin client. COMPARISON:  Mammogram June 24, 2020 FINDINGS: Breast composition: b. Scattered fibroglandular tissue. Background parenchymal enhancement: Mild. Right breast: There is 0.9 x 0.5 cm focal enhancement in the central posterior depth right breast at the level of the nipple (series 12, image 60.) there is focal enhancement in the lateral posterior depth right breast at the level of the nipple measuring 0.9 x 1.0 cm (series 12, image 65.) Left breast: There is non masslike clumped enhancement involving the central and medial left  breast spanning from the upper to the lower left breast measuring at least 9.2 cm in anterior-posterior dimension, 3.8 cm in the medial to central dimension 5 cm in craniocaudal dimension. There is focal enhancement in the lateral lower left breast measuring 1.6 x 1.1 cm (series 12, image 65.) Lymph nodes: No abnormal appearing lymph nodes. Ancillary findings:  None. IMPRESSION: Suspicious findings. 1.Abnormal focal enhancement in the right breast as described. 2. Abnormal non masslike clumped enhancement in the large area of left breast as described. Abnormal focal enhancement in the lateral lower left breast as described. RECOMMENDATIONS: 1. Recommend MRI guided core biopsies of focal enhancement in the central posterior right breast and lateral posterior right breast as described. 2. Recommend MRI guided core biopsies of non masslike clumped enhancement involving a large area medial and central left breast as described. 3. Recommend MRI guided core biopsy of focal enhancement in the lateral lower left breast. BI-RADS CATEGORY  4: Suspicious. Electronically Signed   By: Abelardo Diesel M.D.   On: 12/26/2020 11:16   MM CLIP PLACEMENT LEFT  Result Date: 01/02/2021 CLINICAL DATA:  Evaluate post biopsy marker clip placements following MRI guided biopsy 2 areas of non mass enhancement in the left breast. EXAM:  DIAGNOSTIC LEFT MAMMOGRAM POST MRI BIOPSY COMPARISON:  Previous exam(s). FINDINGS: Mammographic images were obtained following MRI guided biopsy of 2 areas of non mass enhancement in the left breast. The cylinder shaped biopsy clip corresponds to the large area of non mass enhancement in the upper inner left breast. The barbell shaped biopsy clip corresponds to the expected location of smaller area of focal enhancement noted in the lateral left breast. IMPRESSION: Appropriate positioning of the cylinder and barbell shaped biopsy marking clips at the site of biopsy in the upper inner and lateral left breast as detailed above. Final Assessment: Post Procedure Mammograms for Marker Placement Electronically Signed   By: Lajean Manes M.D.   On: 01/02/2021 09:54   MM CLIP PLACEMENT RIGHT  Result Date: 01/08/2021 CLINICAL DATA:  Evaluate CYLINDER and BARBELL biopsy marker clip placement following MR guided RIGHT breast biopsies. EXAM: DIAGNOSTIC RIGHT MAMMOGRAM POST MRI BIOPSY COMPARISON:  Previous exam(s). FINDINGS: Mammographic images were obtained following MR guided biopsies of non masslike enhancement within the central RIGHT breast (CYLINDER clip) and within the UPPER-OUTER RIGHT breast (BARBELL clip). The CYLINDER biopsy marking clip is in expected position at the site of biopsy. The BARBELL biopsy marking clip is in expected position at the site of biopsy. The 2 clips are separated by a distance of 4 cm. IMPRESSION: Appropriate positioning of the CYLINDER shaped biopsy marking clip at the site of biopsy in the central RIGHT breast. Appropriate positioning of the BARBELL shaped biopsy marking clip at the site of biopsy in the UPPER OUTER RIGHT breast Final Assessment: Post Procedure Mammograms for Marker Placement Electronically Signed   By: Margarette Canada M.D.   On: 01/08/2021 10:28   MR LT BREAST BX W LOC DEV 1ST LESION IMAGE BX SPEC MR GUIDE  Addendum Date: 01/10/2021   ADDENDUM REPORT: 01/07/2021 08:11  ADDENDUM: Pathology revealed INTERMEDIATE GRADE DUCTAL CARCINOMA IN SITU of the LEFT breast, upper inner quadrant (cylinder clip). This was found to be concordant by Dr. Lajean Manes. Pathology revealed FIBROCYSTIC CHANGES WITH APOCRINE METAPLASIA- NO MALIGNANCY IDENTIFIED of the LEFT breat, 3 o'clock, lateral (barbell clip). This was found to be concordant by Dr. Lajean Manes. Pathology results were discussed with the patient by telephone. The patient reported doing well  after the biopsies with tenderness at the sites. Post biopsy instructions and care were reviewed and questions were answered. The patient was encouraged to call [facility] for any additional concerns. Per patient request, surgical consultation has been arranged with Dr. Autumn Messing at [facility] on Jan 14, 2021. RIGHT breast MR biopsies are rescheduled for Jan 08, 2021. Pathology results reported by Stacie Acres RN on 01/07/2021. Electronically Signed   By: Lajean Manes M.D.   On: 01/07/2021 08:11   Result Date: 01/10/2021 CLINICAL DATA:  Patient presents for MRI guided core needle biopsy of 2 areas of abnormal enhancement in the left breast. EXAM: MRI GUIDED CORE NEEDLE BIOPSY OF THE LEFT BREAST: 2 BIOPSIES PERFORMED. TECHNIQUE: Multiplanar, multisequence MR imaging of the left breast was performed both before and after administration of intravenous contrast. CONTRAST:  8 mL of Gadavist. COMPARISON:  Breast MRI, 06/24/2020. FINDINGS: I met with the patient, and we discussed the procedure of MRI guided biopsy, including risks, benefits, and alternatives. Specifically, we discussed the risks of infection, bleeding, tissue injury, clip migration, and inadequate sampling. Informed, written consent was given. The usual time out protocol was performed immediately prior to the procedure. Lesion #1: Upper, inner quadrant large area of non-mass enhancement. Using sterile technique, 1% Lidocaine, MRI guidance, and a  9 gauge vacuum assisted device, biopsy was performed of the most confluent area of the large span of non mass enhancement in the upper inner left breast using a lateral approach. At the conclusion of the procedure, a cylinder shaped tissue marker clip was deployed into the biopsy cavity. Lesion #2: 3 o'clock, lateral, 1.6 cm area of non-mass enhancement. Using sterile technique, 1% Lidocaine, MRI guidance, and a 9 gauge vacuum assisted device, biopsy was performed of the smaller area of non mass enhancement in the lateral left breast using a lateral approach. At the conclusion of the procedure, a barbell shaped tissue marker clip was deployed into the biopsy cavity. Follow-up 2-view mammogram was performed and dictated separately. IMPRESSION: MRI guided biopsy of 2 areas of abnormal enhancement in the left breast. No apparent complications. Electronically Signed: By: Lajean Manes M.D. On: 01/02/2021 09:23   MR LT BREAST BX W LOC DEV EA ADD LESION IMAGE BX SPEC MR GUIDE  Addendum Date: 01/10/2021   ADDENDUM REPORT: 01/07/2021 08:11 ADDENDUM: Pathology revealed INTERMEDIATE GRADE DUCTAL CARCINOMA IN SITU of the LEFT breast, upper inner quadrant (cylinder clip). This was found to be concordant by Dr. Lajean Manes. Pathology revealed FIBROCYSTIC CHANGES WITH APOCRINE METAPLASIA- NO MALIGNANCY IDENTIFIED of the LEFT breat, 3 o'clock, lateral (barbell clip). This was found to be concordant by Dr. Lajean Manes. Pathology results were discussed with the patient by telephone. The patient reported doing well after the biopsies with tenderness at the sites. Post biopsy instructions and care were reviewed and questions were answered. The patient was encouraged to call [facility] for any additional concerns. Per patient request, surgical consultation has been arranged with Dr. Autumn Messing at [facility] on Jan 14, 2021. RIGHT breast MR biopsies are rescheduled for Jan 08, 2021.  Pathology results reported by Stacie Acres RN on 01/07/2021. Electronically Signed   By: Lajean Manes M.D.   On: 01/07/2021 08:11   Result Date: 01/10/2021 CLINICAL DATA:  Patient presents for MRI guided core needle biopsy of 2 areas of abnormal enhancement in the left breast. EXAM: MRI GUIDED CORE NEEDLE BIOPSY OF THE LEFT BREAST: 2  BIOPSIES PERFORMED. TECHNIQUE: Multiplanar, multisequence MR imaging of the left breast was performed both before and after administration of intravenous contrast. CONTRAST:  8 mL of Gadavist. COMPARISON:  Breast MRI, 06/24/2020. FINDINGS: I met with the patient, and we discussed the procedure of MRI guided biopsy, including risks, benefits, and alternatives. Specifically, we discussed the risks of infection, bleeding, tissue injury, clip migration, and inadequate sampling. Informed, written consent was given. The usual time out protocol was performed immediately prior to the procedure. Lesion #1: Upper, inner quadrant large area of non-mass enhancement. Using sterile technique, 1% Lidocaine, MRI guidance, and a 9 gauge vacuum assisted device, biopsy was performed of the most confluent area of the large span of non mass enhancement in the upper inner left breast using a lateral approach. At the conclusion of the procedure, a cylinder shaped tissue marker clip was deployed into the biopsy cavity. Lesion #2: 3 o'clock, lateral, 1.6 cm area of non-mass enhancement. Using sterile technique, 1% Lidocaine, MRI guidance, and a 9 gauge vacuum assisted device, biopsy was performed of the smaller area of non mass enhancement in the lateral left breast using a lateral approach. At the conclusion of the procedure, a barbell shaped tissue marker clip was deployed into the biopsy cavity. Follow-up 2-view mammogram was performed and dictated separately. IMPRESSION: MRI guided biopsy of 2 areas of abnormal enhancement in the left breast. No apparent complications. Electronically Signed: By: Lajean Manes M.D. On: 01/02/2021 09:23   MR RT BREAST BX W LOC DEV 1ST LESION IMAGE BX SPEC MR GUIDE  Addendum Date: 01/10/2021   ADDENDUM REPORT: 01/10/2021 11:44 ADDENDUM: Pathology revealed FIBROCYSTIC CHANGE AND ADENOSIS, FOCAL DEGENERATIVE CHANGES- NO MALIGNANCY IDENTIFIED of the RIGHT breast, central. This was found to be concordant by Dr. Hassan Rowan. Pathology revealed FIBROCYSTIC CHANGE, ADENOSIS, AND USUAL DUCTAL HYPERPLASIA, PSEUDOANGIOMATOUS STROMAL HYPERPLASIA ([place]), FOCAL DEGENERATIVE CHANGE- NO MALIGNANCY IDENTIFIED of the RIGHT breast, posterior upper outer. This was found to be concordant by Dr. Hassan Rowan. Pathology results were discussed with the patient by telephone. The patient reported doing well after the biopsies with tenderness at the sites. Post biopsy instructions and care were reviewed and questions were answered. The patient was encouraged to call [facility] for any additional concerns. The patient has a recent diagnosis of left breast cancer and should follow her outlined treatment plan. Breast MRI recommended in 6 months per protocol to follow RIGHT side. Pathology results reported by Stacie Acres RN on 01/10/2021. Electronically Signed   By: Margarette Canada M.D.   On: 01/10/2021 11:44   Result Date: 01/10/2021 CLINICAL DATA:  40 year old female for tissue sampling of 0.9 cm enhancement within the central RIGHT breast (CYLINDER clip) and 1 cm enhancement within the UPPER-OUTER RIGHT breast (BARBELL clip). Recent diagnosis of LEFT breast DCIS. EXAM: MRI GUIDED CORE NEEDLE BIOPSY OF THE RIGHT BREAST x 2 TECHNIQUE: Multiplanar, multisequence MR imaging of the RIGHT breast was performed both before and after administration of intravenous contrast. CONTRAST:  72m GADAVIST GADOBUTROL 1 MMOL/ML IV SOLN COMPARISON:  Previous exams. FINDINGS: I met with the patient, and we discussed the procedure of MRI guided biopsy, including risks, benefits, and alternatives. Specifically,  we discussed the risks of infection, bleeding, tissue injury, clip migration, and inadequate sampling. Informed, written consent was given. The usual time out protocol was performed immediately prior to the procedure. MRI GUIDED CORE NEEDLE BIOPSY OF THE RIGHT BREAST #1 (Central RIGHT breast-CYLINDER clip): Using sterile technique, 1% Lidocaine with  and without epinephrine, MRI guidance, and a 9 gauge vacuum assisted device, biopsy was performed of the 0.9 cm area of enhancement within the central RIGHT breast using a LATERAL approach. At the conclusion of the procedure, a CYLINDER tissue marker clip was deployed into the biopsy cavity. Follow-up 2-view mammogram was performed and dictated separately. MRI GUIDED CORE NEEDLE BIOPSY OF THE RIGHT BREAST #2 (posterior UPPER-OUTER RIGHT breast-BARBELL clip): Using sterile technique, 1% Lidocaine, MRI guidance, and a 9 gauge vacuum assisted device, biopsy was performed of the 1 cm area of enhancement within the posterior UPPER-OUTER RIGHT breast using a LATERAL approach. At the conclusion of the procedure, a BARBELL tissue marker clip was deployed into the biopsy cavity. Follow-up 2-view mammogram was performed and dictated separately. IMPRESSION: MRI guided biopsies of 0.9 cm central RIGHT breast enhancement and 1 cm posterior UPPER-OUTER RIGHT breast enhancement. No apparent complications. Electronically Signed: By: Margarette Canada M.D. On: 01/08/2021 10:39   MR RT BREAST BX W LOC DEV EA ADD LESION IMAGE BX SPEC MR GUIDE  Addendum Date: 01/10/2021   ADDENDUM REPORT: 01/10/2021 11:44 ADDENDUM: Pathology revealed FIBROCYSTIC CHANGE AND ADENOSIS, FOCAL DEGENERATIVE CHANGES- NO MALIGNANCY IDENTIFIED of the RIGHT breast, central. This was found to be concordant by Dr. Hassan Rowan. Pathology revealed FIBROCYSTIC CHANGE, ADENOSIS, AND USUAL DUCTAL HYPERPLASIA, PSEUDOANGIOMATOUS STROMAL HYPERPLASIA ([place]), FOCAL DEGENERATIVE CHANGE- NO MALIGNANCY IDENTIFIED of the RIGHT breast,  posterior upper outer. This was found to be concordant by Dr. Hassan Rowan. Pathology results were discussed with the patient by telephone. The patient reported doing well after the biopsies with tenderness at the sites. Post biopsy instructions and care were reviewed and questions were answered. The patient was encouraged to call [facility] for any additional concerns. The patient has a recent diagnosis of left breast cancer and should follow her outlined treatment plan. Breast MRI recommended in 6 months per protocol to follow RIGHT side. Pathology results reported by Stacie Acres RN on 01/10/2021. Electronically Signed   By: Margarette Canada M.D.   On: 01/10/2021 11:44   Result Date: 01/10/2021 CLINICAL DATA:  40 year old female for tissue sampling of 0.9 cm enhancement within the central RIGHT breast (CYLINDER clip) and 1 cm enhancement within the UPPER-OUTER RIGHT breast (BARBELL clip). Recent diagnosis of LEFT breast DCIS. EXAM: MRI GUIDED CORE NEEDLE BIOPSY OF THE RIGHT BREAST x 2 TECHNIQUE: Multiplanar, multisequence MR imaging of the RIGHT breast was performed both before and after administration of intravenous contrast. CONTRAST:  5m GADAVIST GADOBUTROL 1 MMOL/ML IV SOLN COMPARISON:  Previous exams. FINDINGS: I met with the patient, and we discussed the procedure of MRI guided biopsy, including risks, benefits, and alternatives. Specifically, we discussed the risks of infection, bleeding, tissue injury, clip migration, and inadequate sampling. Informed, written consent was given. The usual time out protocol was performed immediately prior to the procedure. MRI GUIDED CORE NEEDLE BIOPSY OF THE RIGHT BREAST #1 (Central RIGHT breast-CYLINDER clip): Using sterile technique, 1% Lidocaine with and without epinephrine, MRI guidance, and a 9 gauge vacuum assisted device, biopsy was performed of the 0.9 cm area of enhancement within the central RIGHT breast using a LATERAL approach. At the  conclusion of the procedure, a CYLINDER tissue marker clip was deployed into the biopsy cavity. Follow-up 2-view mammogram was performed and dictated separately. MRI GUIDED CORE NEEDLE BIOPSY OF THE RIGHT BREAST #2 (posterior UPPER-OUTER RIGHT breast-BARBELL clip): Using sterile technique, 1% Lidocaine, MRI guidance, and a 9 gauge vacuum assisted device, biopsy was  performed of the 1 cm area of enhancement within the posterior UPPER-OUTER RIGHT breast using a LATERAL approach. At the conclusion of the procedure, a BARBELL tissue marker clip was deployed into the biopsy cavity. Follow-up 2-view mammogram was performed and dictated separately. IMPRESSION: MRI guided biopsies of 0.9 cm central RIGHT breast enhancement and 1 cm posterior UPPER-OUTER RIGHT breast enhancement. No apparent complications. Electronically Signed: By: Margarette Canada M.D. On: 01/08/2021 10:39    All questions were answered. The patient knows to call the clinic with any problems, questions or concerns. I spent 30 minutes in the care of this patient including reviewing records, pathology results, discussion about DCIS, role of surgery, antiestrogen therapy and radiation, discussed about risks of other cancer given BRCA 2 mutation, Encouraged dermatology annual follow-up, discussed symptoms of pancreatic cancer and the need to promptly alert [place] with any symptoms.    Benay Pike, MD 01/10/2021 2:47 PM

## 2021-01-10 NOTE — Telephone Encounter (Signed)
Called to let Ebony Watson know that the estrace tablet was sent in at mid range dosing and could be adjusted if she felt it was not working. She proceeded to say that she was just diagnosed with breast cancer and it is ER and PR positive. Reviewed with Joylene John, NP. Pt to remove patch and not start the estrogen tablets. Pt has an appointment this afternoon with medical oncologist Dr. Chryl Heck. Spoke with Dr. Chryl Heck directly to inform her of the need for medication management of menopausal symptoms now in the setting of ER and PR positive breast cancer. Dr. Chryl Heck said that she will address this with the patient today at her visit.

## 2021-01-15 ENCOUNTER — Other Ambulatory Visit: Payer: Self-pay | Admitting: Hematology and Oncology

## 2021-01-15 NOTE — Progress Notes (Signed)
Tried calling patient a couple times to discuss about Dr Ethlyn Gallery message. No answer, left a brief voicemail to call us back.  Ebony Watson

## 2021-01-16 ENCOUNTER — Encounter: Payer: Self-pay | Admitting: *Deleted

## 2021-01-21 ENCOUNTER — Ambulatory Visit (INDEPENDENT_AMBULATORY_CARE_PROVIDER_SITE_OTHER): Payer: BC Managed Care – PPO | Admitting: Plastic Surgery

## 2021-01-21 ENCOUNTER — Ambulatory Visit: Payer: Self-pay | Admitting: General Surgery

## 2021-01-21 ENCOUNTER — Other Ambulatory Visit: Payer: Self-pay

## 2021-01-21 ENCOUNTER — Encounter: Payer: Self-pay | Admitting: Plastic Surgery

## 2021-01-21 VITALS — BP 135/82 | HR 84 | Ht 67.0 in | Wt 189.8 lb

## 2021-01-21 DIAGNOSIS — Z1501 Genetic susceptibility to malignant neoplasm of breast: Secondary | ICD-10-CM

## 2021-01-21 DIAGNOSIS — Z1509 Genetic susceptibility to other malignant neoplasm: Secondary | ICD-10-CM | POA: Diagnosis not present

## 2021-01-21 DIAGNOSIS — C50212 Malignant neoplasm of upper-inner quadrant of left female breast: Secondary | ICD-10-CM

## 2021-01-21 DIAGNOSIS — Z170 Estrogen receptor positive status [ER+]: Secondary | ICD-10-CM | POA: Diagnosis not present

## 2021-01-21 DIAGNOSIS — Z1502 Genetic susceptibility to malignant neoplasm of ovary: Secondary | ICD-10-CM

## 2021-01-21 DIAGNOSIS — D0512 Intraductal carcinoma in situ of left breast: Secondary | ICD-10-CM

## 2021-01-21 DIAGNOSIS — C50919 Malignant neoplasm of unspecified site of unspecified female breast: Secondary | ICD-10-CM | POA: Insufficient documentation

## 2021-01-21 NOTE — Progress Notes (Signed)
Patient ID: Ebony Watson, female    DOB: 05/31/1981, 40 y.o.   MRN: [id number]   Chief Complaint  Patient presents with  . Advice Only    The patient is a 40 year old female here for consultation for breast reconstruction.  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  It is estrogen and progesterone positive.  She has a positive family history for breast cancer.  She went ahead and had an oophorectomy.  She is not a smoker.  She is 5 feet 7 inches tall weighs 189 pounds.  Radiation is not planned.  She wants to go ahead with bilateral mastectomies and does not want to save her nipple areola as she wants to minimize any risk.  She says she is seeing bad outcomes and she wants to do everything she can to have optimal healing.  She has some sensitivities to some of the pain meds.  She says tramadol and Toradol worked well for her.  She also has some sensitivity to the Steri-Strips and would like to avoid them if possible.  Family history as noted below.  The patient wishes for surgical management of her breast cancer.   Review of Systems  Constitutional: Negative.   HENT: Negative.   Eyes: Negative.   Respiratory: Negative.  Negative for chest tightness and shortness of breath.   Cardiovascular: Negative for leg swelling.  Gastrointestinal: Negative for abdominal distention.  Endocrine: Negative.   Genitourinary: Negative.   Musculoskeletal: Negative.   Neurological: Negative.   Hematological: Negative.   Psychiatric/Behavioral: Negative.     Past Medical History:  Diagnosis Date  . Anxiety   . Asthma   . BRCA2 gene mutation positive in female   . Depression   . Hyperlipidemia     Past Surgical History:  Procedure Laterality Date  . TUBAL LIGATION    . WISDOM TOOTH EXTRACTION    . XI ROBOTIC ASSISTED OOPHORECTOMY Bilateral 12/10/2020   Procedure: XI ROBOTIC ASSISTED BILATERAL  OOPHORECTOMY,   INTRAUTERINE DEVICE (IUD) INSERTION MIRENA;  Surgeon: Lafonda Mosses, MD;  Location: [facility];  Service: Gynecology;  Laterality: Bilateral;      Current Outpatient Medications:  .  ADVAIR DISKUS 250-50 MCG/DOSE AEPB, Inhale 1 puff into the lungs 2 (two) times daily., Disp: , Rfl:  .  albuterol (VENTOLIN HFA) 108 (90 Base) MCG/ACT inhaler, Inhale 2 puffs into the lungs every 4 (four) hours as needed (Asthma)., Disp: , Rfl:  .  Cholecalciferol 125 MCG (5000 UT) capsule, Take 5,000 Units by mouth daily., Disp: , Rfl:  .  loratadine (CLARITIN) 10 MG tablet, Take 10 mg by mouth daily., Disp: , Rfl:  .  Multiple Vitamin (MULTI-VITAMIN DAILY PO), Take 1 tablet by mouth daily., Disp: , Rfl:  .  Omega-3 Fatty Acids (FISH OIL PO), Take 1 capsule by mouth daily., Disp: , Rfl:  .  Probiotic Product (PROBIOTIC DAILY PO), Take 1 tablet by mouth daily., Disp: , Rfl:    Objective:   Vitals:   01/21/21 0853  BP: 135/82  Pulse: 84  SpO2: 97%    Physical Exam Vitals and nursing note reviewed.  Constitutional:      Appearance: Normal appearance.  HENT:     Head: Normocephalic and atraumatic.  Cardiovascular:     Rate and Rhythm: Normal rate.     Pulses: Normal pulses.  Pulmonary:     Effort: Pulmonary effort is  normal.  Abdominal:     General: Abdomen is flat. There is no distension.  Skin:    General: Skin is warm.     Capillary Refill: Capillary refill takes less than 2 seconds.     Coloration: Skin is not jaundiced.     Findings: No bruising.  Neurological:     General: No focal deficit present.     Mental Status: She is alert and oriented to person, place, and time.  Psychiatric:        Mood and Affect: Mood normal.        Behavior: Behavior normal.        Thought Content: Thought content normal.     Assessment & Plan:  BRCA2 gene mutation positive in female  Malignant neoplasm of upper-inner quadrant of left breast in female, estrogen receptor positive ([place])  The options  for reconstruction we explained to the patient / family for breast reconstruction.  There are two general categories of reconstruction.  We can reconstruction a breast with implants or use the patient's own tissue.  These were further discussed as listed.  Breast reconstruction is an optional procedure and eligibility depends on the full spectrum of the health of the patient and any co-morbidities.  More than one surgery is often needed to complete the reconstruction process.  The process can take three to twelve months to complete.  The breasts will not be identical due to many factors such as rib differences, shoulder asymmetry and treatments such as radiation.  The goal is to get the breasts to look normal and symmetrical in clothes.  Scars are a part of surgery and may fade some in time but will always be present under clothes.  Surgery may be an option on the non-cancer breast to achieve more symmetry.  No matter which procedure is chosen there is always the risk of complications and even failure of the body to heal.  This could result in no breast.    The options for reconstruction include:  1. Placement of a tissue expander with Acellular dermal matrix. When the expander is the desired size surgery is performed to remove the expander and place an implant.  In some cases the implant can be placed without an expander.  2. Autologous reconstruction can include using a muscle or tissue from another area of the body to create a breast.  3. Combined procedures (ie. latissismus dorsi flap) can be done with an expander / implant placed under the muscle.   The risks, benefits, scars and recovery time were discussed for each of the above. Risks include bleeding, infection, hematoma, seroma, scarring, pain, wound healing complications, flap loss, fat necrosis, capsular contracture, need for implant removal, donor site complications, bulge, hernia, umbilical necrosis, need for urgent reoperation, and need for  dressing changes.   The procedure the patient selected / that was best for the patient, was then discussed in further detail.  Total time: 45 minutes. This includes time spent with the patient during the visit as well as time spent before and after the visit reviewing the chart, documenting the encounter, making phone calls and reviewing studies.   The patient was very confident she wanted to move ahead with bilateral mastectomies.  She would like a immediate reconstruction with expander and Flex HD.  We will also send her the brochure for the breast reconstruction. Pictures were obtained of the patient and placed in the chart with the patient's or guardian's permission.  [place], DO

## 2021-01-23 ENCOUNTER — Encounter: Payer: Self-pay | Admitting: *Deleted

## 2021-01-23 ENCOUNTER — Encounter: Payer: Self-pay | Admitting: Hematology and Oncology

## 2021-01-24 ENCOUNTER — Telehealth: Payer: Self-pay | Admitting: Hematology and Oncology

## 2021-01-24 MED ORDER — ZOLPIDEM TARTRATE 5 MG PO TABS: 5.0000 mg | ORAL_TABLET | Freq: Every evening | ORAL | 0 refills | Status: DC | PRN

## 2021-01-24 MED ORDER — VENLAFAXINE HCL ER 37.5 MG PO CP24: 37.5000 mg | ORAL_CAPSULE | Freq: Every day | ORAL | 0 refills | Status: DC

## 2021-01-24 NOTE — Telephone Encounter (Signed)
Scheduled appt per 6/2 sch msg. Called pt, no answer. Left msg with appt date and time.  

## 2021-01-27 ENCOUNTER — Encounter: Payer: Self-pay | Admitting: *Deleted

## 2021-02-04 ENCOUNTER — Encounter (HOSPITAL_BASED_OUTPATIENT_CLINIC_OR_DEPARTMENT_OTHER): Payer: Self-pay | Admitting: General Surgery

## 2021-02-04 ENCOUNTER — Other Ambulatory Visit: Payer: Self-pay

## 2021-02-04 ENCOUNTER — Ambulatory Visit: Payer: BC Managed Care – PPO | Attending: General Surgery | Admitting: Rehabilitation

## 2021-02-04 ENCOUNTER — Encounter: Payer: Self-pay | Admitting: Rehabilitation

## 2021-02-04 DIAGNOSIS — R293 Abnormal posture: Secondary | ICD-10-CM | POA: Insufficient documentation

## 2021-02-04 NOTE — Therapy (Signed)
[place], [place], [address] Phone: [phone]   Fax:  [phone]  Physical Therapy Evaluation  Patient Details  Name: Ebony Watson MRN: [id number] Date of Birth: 1980-10-22 Referring Provider (PT): Dr. Marlou Starks   Encounter Date: 02/04/2021   PT End of Session - 02/04/21 0940     Visit Number 1    Number of Visits 2    Date for PT Re-Evaluation 03/21/21    PT Start Time 0900    PT Stop Time 0936    PT Time Calculation (min) 36 min    Activity Tolerance Patient tolerated treatment well    Behavior During Therapy [facility] for tasks assessed/performed             Past Medical History:  Diagnosis Date   Anxiety    Asthma    BRCA2 gene mutation positive in female    Depression    Hyperlipidemia     Past Surgical History:  Procedure Laterality Date   TUBAL LIGATION     WISDOM TOOTH EXTRACTION     XI ROBOTIC ASSISTED OOPHORECTOMY Bilateral 12/10/2020   Procedure: XI ROBOTIC ASSISTED BILATERAL  OOPHORECTOMY,  INTRAUTERINE DEVICE (IUD) INSERTION MIRENA;  Surgeon: Lafonda Mosses, MD;  Location: [facility];  Service: Gynecology;  Laterality: Bilateral;    There were no vitals filed for this visit.    Subjective Assessment - 02/04/21 0901     Subjective Doing well    Pertinent History Pt will be having bilateral mastectomy with immediate expander placement on 02/20/21 by Dr. Marlou Starks  / [place] due to DCIS found on the left and BRCA2 positive status.    Patient Stated Goals get information from providers    Currently in Pain? No/denies                [facility] PT Assessment - 02/04/21 0001       Assessment   Medical Diagnosis left breast cancer    Referring Provider (PT) Dr. Marlou Starks    Onset Date/Surgical Date 02/04/21    Hand Dominance Right    Next MD Visit next week    Prior Therapy no      Precautions   Precaution Comments active cancer      Restrictions   Weight Bearing  Restrictions No      Balance Screen   Has the patient fallen in the past 6 months No    Has the patient had a decrease in activity level because of a fear of falling?  No    Is the patient reluctant to leave their home because of a fear of falling?  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 and does not have to go back 100%    Leisure walking, 2-3x per week      Posture/Postural Control   Posture/Postural Control Postural limitations    Postural Limitations Rounded Shoulders;Forward head      ROM / Strength   AROM / PROM / Strength AROM      AROM   AROM Assessment Site Shoulder    Right/Left Shoulder Right;Left    Right Shoulder Extension 65 Degrees    Right  Shoulder Flexion 172 Degrees    Right Shoulder ABduction 168 Degrees    Right Shoulder External Rotation 100 Degrees    Left Shoulder Extension 63 Degrees    Left Shoulder Flexion 165 Degrees    Left Shoulder ABduction 168 Degrees    Left Shoulder External Rotation 104 Degrees               LYMPHEDEMA/ONCOLOGY QUESTIONNAIRE - 02/04/21 0001       Type   Cancer Type Rt breat cancer      Lymphedema Assessments   Lymphedema Assessments Upper extremities      Right Upper Extremity Lymphedema   10 cm Proximal to Olecranon Process 33 cm    Olecranon Process 26.5 cm    10 cm Proximal to Ulnar Styloid Process 22.8 cm    Just Proximal to Ulnar Styloid Process 15.5 cm    Across Hand at [employer] 19.5 cm    At [place] of 2nd Digit 6.4 cm      Left Upper Extremity Lymphedema   10 cm Proximal to Olecranon Process 33.1 cm    Olecranon Process 27 cm    10 cm Proximal to Ulnar Styloid Process 15.5 cm    Across Hand at [employer] 19.4 cm    At [place] of 2nd  Digit 6.2 cm             L-DEX FLOWSHEETS - 02/04/21 0900       L-DEX LYMPHEDEMA SCREENING   Measurement Type Unilateral    L-DEX MEASUREMENT EXTREMITY Upper Extremity    POSITION  Standing    DOMINANT SIDE Right    At Risk Side Left    BASELINE SCORE (UNILATERAL) -1.7                    Objective measurements completed on examination: See above findings.               PT Education - 02/04/21 0939     Education Details POC, reasons for PT, lymphedema briefly, post op stretches and walking    Person(s) Educated Patient    Methods Explanation;Demonstration;Handout    Comprehension Verbalized understanding                 PT Long Term Goals - 02/04/21 0943       PT LONG TERM GOAL #1   Title Pt will return to baseline AROM    Time 8    Period Weeks    Status New      PT LONG TERM GOAL #2   Title Pt will be scheduled for SOZO follow ups    Time [place] [facility] Goals - 02/04/21 0943       Patient will be able to verbalize understanding of pertinent lymphedema risk reduction practices relevant to her diagnosis specifically related to skin care.   Status Achieved      Patient will be able to return demonstrate and/or verbalize understanding of the post-op home exercise program related to regaining shoulder range of motion.   Status Achieved      Patient will be able to verbalize understanding of the importance of attending the postoperative After Breast Cancer Class for further lymphedema risk reduction education and therapeutic exercise.   Status Achieved  Plan - 02/04/21 0940     Clinical Impression Statement Pt presents pre bilateral mastectomy and SLNB for baseline assessment.  ROM, circumferential measurements, and L-Dex measures taken with education on return to stretching post operatively as well as role of PT in POC.    Personal Factors and Comorbidities  Comorbidity 1    Comorbidities BRCA2    Stability/Clinical Decision Making Stable/Uncomplicated    Clinical Decision Making Low    Rehab Potential Excellent    PT Frequency --   post op only for now   PT Duration 8 weeks    PT Treatment/Interventions ADLs/Self Care Home Management;Therapeutic exercise;Patient/family education    PT Next Visit Plan post op check, schedule SOZO, sign up for ABC or provide info in person    PT Home Exercise Plan post op    Consulted and Agree with Plan of Care Patient             Patient will benefit from skilled therapeutic intervention in order to improve the following deficits and impairments:  Decreased knowledge of precautions  Visit Diagnosis: Abnormal posture     Problem List Patient Active Problem List   Diagnosis Date Noted   Breast cancer ([place]) 01/21/2021   BRCA2 gene mutation positive in female 11/25/2020    Stark Bray 02/04/2021, 9:44 AM  [place], [place], [address] Phone: [phone]   Fax:  [phone]  Name: Yuki Brunsman MRN: [id number] Date of Birth: 09/11/1980

## 2021-02-04 NOTE — Patient Instructions (Signed)

## 2021-02-11 ENCOUNTER — Other Ambulatory Visit: Payer: Self-pay

## 2021-02-11 ENCOUNTER — Ambulatory Visit (INDEPENDENT_AMBULATORY_CARE_PROVIDER_SITE_OTHER): Payer: BC Managed Care – PPO | Admitting: Surgical

## 2021-02-11 ENCOUNTER — Encounter: Payer: Self-pay | Admitting: Surgical

## 2021-02-11 VITALS — BP 129/84 | HR 87 | Ht 67.0 in | Wt 187.8 lb

## 2021-02-11 DIAGNOSIS — Z170 Estrogen receptor positive status [ER+]: Secondary | ICD-10-CM

## 2021-02-11 DIAGNOSIS — Z1501 Genetic susceptibility to malignant neoplasm of breast: Secondary | ICD-10-CM

## 2021-02-11 DIAGNOSIS — C50212 Malignant neoplasm of upper-inner quadrant of left female breast: Secondary | ICD-10-CM

## 2021-02-11 DIAGNOSIS — Z1502 Genetic susceptibility to malignant neoplasm of ovary: Secondary | ICD-10-CM

## 2021-02-11 DIAGNOSIS — Z1509 Genetic susceptibility to other malignant neoplasm: Secondary | ICD-10-CM

## 2021-02-11 MED ORDER — CEPHALEXIN 500 MG PO CAPS: 500.0000 mg | ORAL_CAPSULE | Freq: Four times a day (QID) | ORAL | 0 refills | Status: AC

## 2021-02-11 MED ORDER — DIAZEPAM 2 MG PO TABS: 2.0000 mg | ORAL_TABLET | Freq: Two times a day (BID) | ORAL | 0 refills | Status: DC | PRN

## 2021-02-11 MED ORDER — ONDANSETRON HCL 4 MG PO TABS: 4.0000 mg | ORAL_TABLET | Freq: Three times a day (TID) | ORAL | 0 refills | Status: DC | PRN

## 2021-02-11 MED ORDER — TRAMADOL HCL 50 MG PO TABS: 50.0000 mg | ORAL_TABLET | Freq: Four times a day (QID) | ORAL | 0 refills | Status: AC | PRN

## 2021-02-11 NOTE — H&P (View-Only) (Signed)
Patient ID: Ebony Watson, female    DOB: 1981-07-12, 40 y.o.   MRN: [id number]  Chief Complaint  Patient presents with   Pre-op Exam       ICD-10-CM   1. BRCA2 gene mutation positive in female  Z15.01    Z15.02    Z15.09     2. Malignant neoplasm of upper-inner quadrant of left breast in female, estrogen receptor positive ([place])  C50.212    Z17.0        History of Present Illness: Ebony Watson is a 40 y.o.  female  with a history of BRCA2 and malignant neoplasm of upper-inner quadrant of left breast.  She presents for preoperative evaluation for upcoming procedure, left mastectomy with sentinel lymph node biopsy and right total mastectomy by Dr. Marlou Starks followed by bilateral immediate breast reconstruction placement of tissue expanders and Flex HD, scheduled for 02/20/2021 with Dr. Marla Roe.  The patient has not had problems with anesthesia. No history of DVT/PE.  No family history of DVT/PE.  No family or personal history of bleeding or clotting disorders.  Patient is not currently taking any blood thinners.  No history of CVA/MI.   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  It is ER and PR positive.  She is not a smoker.  She wants to go ahead with bilateral mastectomies and does not want to save her nipple areola as she wants to minimize risk.  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  No recent illness.  Patient has been feeling well lately.  She most recently underwent bilateral oophorectomy on 12/10/2020.  She reports she did well with this.   Past Medical History: Allergies: Allergies  Allergen Reactions   Hydrocodone-Acetaminophen Shortness Of Breath   Oxycodone-Acetaminophen Shortness Of  Breath    Current Medications:  Current Outpatient Medications:    ADVAIR DISKUS 250-50 MCG/DOSE AEPB, Inhale 1 puff into the lungs 2 (two) times daily., Disp: , Rfl:    albuterol (VENTOLIN HFA) 108 (90 Base) MCG/ACT inhaler, Inhale 2 puffs into the lungs every 4 (four) hours as needed (Asthma)., Disp: , Rfl:    Cholecalciferol 125 MCG (5000 UT) capsule, Take 5,000 Units by mouth daily., Disp: , Rfl:    loratadine (CLARITIN) 10 MG tablet, Take 10 mg by mouth daily., Disp: , Rfl:    Multiple Vitamin (MULTI-VITAMIN DAILY PO), Take 1 tablet by mouth daily., Disp: , Rfl:    Omega-3 Fatty Acids (FISH OIL PO), Take 1 capsule by mouth daily., Disp: , Rfl:    OVER THE COUNTER MEDICATION, Black Cohosh-Take 1 capsule by mouth daily., Disp: , Rfl:    Probiotic Product (PROBIOTIC DAILY PO), Take 1 tablet by mouth daily., Disp: , Rfl:    venlafaxine XR (EFFEXOR-XR) 37.5 MG 24 hr capsule, Take 1 capsule (37.5 mg total) by mouth daily with breakfast., Disp: 30 capsule, Rfl: 0  Past Medical Problems: Past Medical History:  Diagnosis Date   Anxiety    Asthma    BRCA2 gene mutation positive in female    Depression    Hyperlipidemia     Past Surgical History: Past Surgical History:  Procedure Laterality Date   TUBAL LIGATION     WISDOM TOOTH EXTRACTION     XI ROBOTIC  ASSISTED OOPHORECTOMY Bilateral 12/10/2020   Procedure: XI ROBOTIC ASSISTED BILATERAL  OOPHORECTOMY,  INTRAUTERINE DEVICE (IUD) INSERTION MIRENA;  Surgeon: Lafonda Mosses, MD;  Location: [facility];  Service: Gynecology;  Laterality: Bilateral;    Social History: Social History   Socioeconomic History   Marital status: Single    Spouse name: Not on file   Number of children: Not on file   Years of education: Not on file   Highest education level: Not on file  Occupational History   Occupation: infection control  Tobacco Use   Smoking status: Former    Packs/day: 0.25    Years: 4.00    Pack years: 1.00    Types: Cigarettes     Quit date: 2007    Years since quitting: 15.4   Smokeless tobacco: Never  Vaping Use   Vaping Use: Never used  Substance and Sexual Activity   Alcohol use: Yes    Comment: socially   Drug use: Never   Sexual activity: Yes    Birth control/protection: Surgical  Other Topics Concern   Not on file  Social History Narrative   Not on file   Social Determinants of Health   Financial Resource Strain: Not on file  Food Insecurity: Not on file  Transportation Needs: Not on file  Physical Activity: Not on file  Stress: Not on file  Social Connections: Not on file  Intimate Partner Violence: Not on file    Family History: Family History  Problem Relation Age of Onset   Drug abuse Mother    Hypertension Mother    Hyperlipidemia Mother    Heart disease Father    Prostate cancer Father    Drug abuse Sister    Breast cancer Maternal Aunt    Ovarian cancer Maternal Aunt    BRCA 1/2 Maternal Aunt    Cervical cancer Sister    Drug abuse Sister     Review of Systems: Review of Systems  Constitutional: Negative.   Respiratory: Negative.    Cardiovascular: Negative.   Gastrointestinal: Negative.   Neurological: Negative.    Physical Exam: Vital Signs BP 129/84 (BP Location: Left Arm, Patient Position: Sitting, Cuff Size: Large)   Pulse 87   Ht 5' 7" (1.702 m)   Wt 187 lb 12.8 oz (85.2 kg)   LMP 11/29/2020   SpO2 96%   BMI 29.41 kg/m   Physical Exam  Constitutional:      General: Not in acute distress.    Appearance: Normal appearance. Not ill-appearing.  HENT:     Head: Normocephalic and atraumatic.  Eyes:     Pupils: Pupils are equal, round Neck:     Musculoskeletal: Normal range of motion.  Cardiovascular:     Rate and Rhythm: Normal rate    Pulses: Normal pulses.  Pulmonary:     Effort: Pulmonary effort is normal. No respiratory distress.  Abdominal:     General: Abdomen is flat. There is no distension.  Musculoskeletal: Normal range of motion.   Skin:    General: Skin is warm and dry.     Findings: No erythema or rash.  Neurological:     General: No focal deficit present.     Mental Status: Alert and oriented to person, place, and time. Mental status is at baseline.     Motor: No weakness.  Psychiatric:        Mood and Affect: Mood normal.        Behavior: Behavior normal.    Assessment/Plan:  The patient is scheduled for bilateral breast reconstruction with tissue expanders and Flex HD with Dr. Marla Roe after bilateral mastectomies by general surgery.  Risks, benefits, and alternatives of procedure discussed, questions answered and consent obtained.    Smoking Status: Non-smoker; Counseling Given?  N/A Last Mammogram: 12/25/2020; Results: Biopsy of left breast showed indeterminate grade ductal carcinoma in situ  Caprini Score: 5, high; Risk Factors include: BMI greater than 25, current breast cancer and length of planned surgery. Recommendation for mechanical and pharmacological prophylaxis for 7 to 10 days postoperatively. Encourage early ambulation.   Pictures obtained:_0   Post-op Rx sent to pharmacy: Tramadol, Zofran, Keflex  Patient was provided with the breast reconstruction and General Surgical Risk consent document and Pain Medication Agreement prior to their appointment.  They had adequate time to read through the risk consent documents and Pain Medication Agreement. We also discussed them in person together during this preop appointment. All of their questions were answered to their satisfaction.  Recommended calling if they have any further questions.  Risk consent form and Pain Medication Agreement to be scanned into patient's chart.  The risks that can be encountered with and after placement of a breast expander placement were discussed and include the following but not limited to these: bleeding, infection, delayed healing, anesthesia risks, skin sensation changes, injury to structures including nerves, blood  vessels, and muscles which may be temporary or permanent, allergies to tape, suture materials and glues, blood products, topical preparations or injected agents, skin contour irregularities, skin discoloration and swelling, deep vein thrombosis, cardiac and pulmonary complications, pain, which may persist, fluid accumulation, wrinkling of the skin over the expander, changes in nipple or breast sensation, expander leakage or rupture, faulty position of the expander, persistent pain, formation of tight scar tissue around the expander (capsular contracture), possible need for revisional surgery or staged procedures.   Electronically signed by: Carola Rhine Leonard Feigel, PA-C 02/11/2021 3:55 PM

## 2021-02-11 NOTE — Progress Notes (Signed)
   Patient ID: Ebony Watson, female    DOB: 08/11/1981, 39 y.o.   MRN: [id number]  Chief Complaint  Patient presents with   Pre-op Exam       ICD-10-CM   1. BRCA2 gene mutation positive in female  Z15.01    Z15.02    Z15.09     2. Malignant neoplasm of upper-inner quadrant of left breast in female, estrogen receptor positive ([facility])  C50.212    Z17.0        History of Present Illness: Ebony Watson is a 39 y.o.  female  with a history of BRCA2 and malignant neoplasm of upper-inner quadrant of left breast.  She presents for preoperative evaluation for upcoming procedure, left mastectomy with sentinel lymph node biopsy and right total mastectomy by Dr. Toth followed by bilateral immediate breast reconstruction placement of tissue expanders and Flex HD, scheduled for 02/20/2021 with Dr. Dillingham.  The patient has not had problems with anesthesia. No history of DVT/PE.  No family history of DVT/PE.  No family or personal history of bleeding or clotting disorders.  Patient is not currently taking any blood thinners.  No history of CVA/MI.   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  It is ER and PR positive.  She is not a smoker.  She wants to go ahead with bilateral mastectomies and does not want to save her nipple areola as she wants to minimize risk.  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  No recent illness.  Patient has been feeling well lately.  She most recently underwent bilateral oophorectomy on 12/10/2020.  She reports she did well with this.   Past Medical History: Allergies: Allergies  Allergen Reactions   Hydrocodone-Acetaminophen Shortness Of Breath   Oxycodone-Acetaminophen Shortness Of  Breath    Current Medications:  Current Outpatient Medications:    ADVAIR DISKUS 250-50 MCG/DOSE AEPB, Inhale 1 puff into the lungs 2 (two) times daily., Disp: , Rfl:    albuterol (VENTOLIN HFA) 108 (90 Base) MCG/ACT inhaler, Inhale 2 puffs into the lungs every 4 (four) hours as needed (Asthma)., Disp: , Rfl:    Cholecalciferol 125 MCG (5000 UT) capsule, Take 5,000 Units by mouth daily., Disp: , Rfl:    loratadine (CLARITIN) 10 MG tablet, Take 10 mg by mouth daily., Disp: , Rfl:    Multiple Vitamin (MULTI-VITAMIN DAILY PO), Take 1 tablet by mouth daily., Disp: , Rfl:    Omega-3 Fatty Acids (FISH OIL PO), Take 1 capsule by mouth daily., Disp: , Rfl:    OVER THE COUNTER MEDICATION, Black Cohosh-Take 1 capsule by mouth daily., Disp: , Rfl:    Probiotic Product (PROBIOTIC DAILY PO), Take 1 tablet by mouth daily., Disp: , Rfl:    venlafaxine XR (EFFEXOR-XR) 37.5 MG 24 hr capsule, Take 1 capsule (37.5 mg total) by mouth daily with breakfast., Disp: 30 capsule, Rfl: 0  Past Medical Problems: Past Medical History:  Diagnosis Date   Anxiety    Asthma    BRCA2 gene mutation positive in female    Depression    Hyperlipidemia     Past Surgical History: Past Surgical History:  Procedure Laterality Date   TUBAL LIGATION     WISDOM TOOTH EXTRACTION     XI ROBOTIC   ASSISTED OOPHORECTOMY Bilateral 12/10/2020   Procedure: XI ROBOTIC ASSISTED BILATERAL  OOPHORECTOMY,  INTRAUTERINE DEVICE (IUD) INSERTION MIRENA;  Surgeon: Lafonda Mosses, MD;  Location: [facility];  Service: Gynecology;  Laterality: Bilateral;    Social History: Social History   Socioeconomic History   Marital status: Single    Spouse name: Not on file   Number of children: Not on file   Years of education: Not on file   Highest education level: Not on file  Occupational History   Occupation: infection control  Tobacco Use   Smoking status: Former    Packs/day: 0.25    Years: 4.00    Pack years: 1.00    Types: Cigarettes     Quit date: 2007    Years since quitting: 15.4   Smokeless tobacco: Never  Vaping Use   Vaping Use: Never used  Substance and Sexual Activity   Alcohol use: Yes    Comment: socially   Drug use: Never   Sexual activity: Yes    Birth control/protection: Surgical  Other Topics Concern   Not on file  Social History Narrative   Not on file   Social Determinants of Health   Financial Resource Strain: Not on file  Food Insecurity: Not on file  Transportation Needs: Not on file  Physical Activity: Not on file  Stress: Not on file  Social Connections: Not on file  Intimate Partner Violence: Not on file    Family History: Family History  Problem Relation Age of Onset   Drug abuse Mother    Hypertension Mother    Hyperlipidemia Mother    Heart disease Father    Prostate cancer Father    Drug abuse Sister    Breast cancer Maternal Aunt    Ovarian cancer Maternal Aunt    BRCA 1/2 Maternal Aunt    Cervical cancer Sister    Drug abuse Sister     Review of Systems: Review of Systems  Constitutional: Negative.   Respiratory: Negative.    Cardiovascular: Negative.   Gastrointestinal: Negative.   Neurological: Negative.    Physical Exam: Vital Signs BP 129/84 (BP Location: Left Arm, Patient Position: Sitting, Cuff Size: Large)   Pulse 87   Ht 5' 7" (1.702 m)   Wt 187 lb 12.8 oz (85.2 kg)   LMP 11/29/2020   SpO2 96%   BMI 29.41 kg/m   Physical Exam  Constitutional:      General: Not in acute distress.    Appearance: Normal appearance. Not ill-appearing.  HENT:     Head: Normocephalic and atraumatic.  Eyes:     Pupils: Pupils are equal, round Neck:     Musculoskeletal: Normal range of motion.  Cardiovascular:     Rate and Rhythm: Normal rate    Pulses: Normal pulses.  Pulmonary:     Effort: Pulmonary effort is normal. No respiratory distress.  Abdominal:     General: Abdomen is flat. There is no distension.  Musculoskeletal: Normal range of motion.   Skin:    General: Skin is warm and dry.     Findings: No erythema or rash.  Neurological:     General: No focal deficit present.     Mental Status: Alert and oriented to person, place, and time. Mental status is at baseline.     Motor: No weakness.  Psychiatric:        Mood and Affect: Mood normal.        Behavior: Behavior normal.    Assessment/Plan:  The patient is scheduled for bilateral breast reconstruction with tissue expanders and Flex HD with Dr. Marla Roe after bilateral mastectomies by general surgery.  Risks, benefits, and alternatives of procedure discussed, questions answered and consent obtained.    Smoking Status: Non-smoker; Counseling Given?  N/A Last Mammogram: 12/25/2020; Results: Biopsy of left breast showed indeterminate grade ductal carcinoma in situ  Caprini Score: 5, high; Risk Factors include: BMI greater than 25, current breast cancer and length of planned surgery. Recommendation for mechanical and pharmacological prophylaxis for 7 to 10 days postoperatively. Encourage early ambulation.   Pictures obtained:_0   Post-op Rx sent to pharmacy: Tramadol, Zofran, Keflex  Patient was provided with the breast reconstruction and General Surgical Risk consent document and Pain Medication Agreement prior to their appointment.  They had adequate time to read through the risk consent documents and Pain Medication Agreement. We also discussed them in person together during this preop appointment. All of their questions were answered to their satisfaction.  Recommended calling if they have any further questions.  Risk consent form and Pain Medication Agreement to be scanned into patient's chart.  The risks that can be encountered with and after placement of a breast expander placement were discussed and include the following but not limited to these: bleeding, infection, delayed healing, anesthesia risks, skin sensation changes, injury to structures including nerves, blood  vessels, and muscles which may be temporary or permanent, allergies to tape, suture materials and glues, blood products, topical preparations or injected agents, skin contour irregularities, skin discoloration and swelling, deep vein thrombosis, cardiac and pulmonary complications, pain, which may persist, fluid accumulation, wrinkling of the skin over the expander, changes in nipple or breast sensation, expander leakage or rupture, faulty position of the expander, persistent pain, formation of tight scar tissue around the expander (capsular contracture), possible need for revisional surgery or staged procedures.   Electronically signed by: Carola Rhine Scheeler, PA-C 02/11/2021 3:55 PM

## 2021-02-19 NOTE — Progress Notes (Signed)

## 2021-02-20 ENCOUNTER — Ambulatory Visit (HOSPITAL_BASED_OUTPATIENT_CLINIC_OR_DEPARTMENT_OTHER): Payer: BC Managed Care – PPO | Admitting: Certified Registered"

## 2021-02-20 ENCOUNTER — Encounter (HOSPITAL_BASED_OUTPATIENT_CLINIC_OR_DEPARTMENT_OTHER): Payer: Self-pay | Admitting: General Surgery

## 2021-02-20 ENCOUNTER — Encounter (HOSPITAL_BASED_OUTPATIENT_CLINIC_OR_DEPARTMENT_OTHER)
Admission: RE | Disposition: A | Discharge status: Home / Self Care | Payer: Self-pay | Source: Home / Self Care | Attending: Plastic Surgery

## 2021-02-20 ENCOUNTER — Ambulatory Visit (HOSPITAL_COMMUNITY)
Admission: RE | Admit: 2021-02-20 | Discharge: 2021-02-20 | Disposition: A | Discharge status: Home / Self Care | Payer: BC Managed Care – PPO | Source: Ambulatory Visit | Attending: General Surgery | Admitting: General Surgery

## 2021-02-20 ENCOUNTER — Ambulatory Visit (HOSPITAL_BASED_OUTPATIENT_CLINIC_OR_DEPARTMENT_OTHER)
Admission: RE | Admit: 2021-02-20 | Discharge: 2021-02-21 | Disposition: A | Discharge status: Home / Self Care | Payer: BC Managed Care – PPO | Attending: Plastic Surgery | Admitting: Plastic Surgery

## 2021-02-20 ENCOUNTER — Other Ambulatory Visit: Payer: Self-pay

## 2021-02-20 DIAGNOSIS — N6489 Other specified disorders of breast: Secondary | ICD-10-CM | POA: Insufficient documentation

## 2021-02-20 DIAGNOSIS — Z170 Estrogen receptor positive status [ER+]: Secondary | ICD-10-CM

## 2021-02-20 DIAGNOSIS — Z1501 Genetic susceptibility to malignant neoplasm of breast: Secondary | ICD-10-CM | POA: Insufficient documentation

## 2021-02-20 DIAGNOSIS — Z885 Allergy status to narcotic agent status: Secondary | ICD-10-CM | POA: Diagnosis not present

## 2021-02-20 DIAGNOSIS — Z1509 Genetic susceptibility to other malignant neoplasm: Secondary | ICD-10-CM | POA: Insufficient documentation

## 2021-02-20 DIAGNOSIS — C50212 Malignant neoplasm of upper-inner quadrant of left female breast: Secondary | ICD-10-CM

## 2021-02-20 DIAGNOSIS — D0512 Intraductal carcinoma in situ of left breast: Secondary | ICD-10-CM

## 2021-02-20 DIAGNOSIS — Z886 Allergy status to analgesic agent status: Secondary | ICD-10-CM | POA: Diagnosis not present

## 2021-02-20 DIAGNOSIS — Z1502 Genetic susceptibility to malignant neoplasm of ovary: Secondary | ICD-10-CM | POA: Diagnosis not present

## 2021-02-20 DIAGNOSIS — Z9013 Acquired absence of bilateral breasts and nipples: Secondary | ICD-10-CM | POA: Diagnosis present

## 2021-02-20 DIAGNOSIS — Z87891 Personal history of nicotine dependence: Secondary | ICD-10-CM | POA: Insufficient documentation

## 2021-02-20 DIAGNOSIS — C50919 Malignant neoplasm of unspecified site of unspecified female breast: Secondary | ICD-10-CM | POA: Diagnosis present

## 2021-02-20 HISTORY — PX: TOTAL MASTECTOMY: SHX6129

## 2021-02-20 HISTORY — PX: MASTECTOMY W/ SENTINEL NODE BIOPSY: SHX2001

## 2021-02-20 HISTORY — PX: BREAST RECONSTRUCTION WITH PLACEMENT OF TISSUE EXPANDER AND FLEX HD (ACELLULAR HYDRATED DERMIS): SHX6295

## 2021-02-20 SURGERY — MASTECTOMY WITH SENTINEL LYMPH NODE BIOPSY
Anesthesia: Regional | Site: Chest | Laterality: Right

## 2021-02-20 MED ORDER — AMISULPRIDE (ANTIEMETIC) 5 MG/2ML IV SOLN
INTRAVENOUS | Status: AC
  Filled 2021-02-20: qty 4

## 2021-02-20 MED ORDER — SCOPOLAMINE 1 MG/3DAYS TD PT72
MEDICATED_PATCH | TRANSDERMAL | Status: DC | PRN
  Administered 2021-02-20: 1 via TRANSDERMAL

## 2021-02-20 MED ORDER — KCL IN DEXTROSE-NACL 20-5-0.45 MEQ/L-%-% IV SOLN
INTRAVENOUS | Status: DC
  Filled 2021-02-20: qty 1000

## 2021-02-20 MED ORDER — HYDROMORPHONE HCL 1 MG/ML IJ SOLN
INTRAMUSCULAR | Status: AC
  Filled 2021-02-20: qty 0.5

## 2021-02-20 MED ORDER — CELECOXIB 200 MG PO CAPS
ORAL_CAPSULE | ORAL | Status: AC
  Filled 2021-02-20: qty 1

## 2021-02-20 MED ORDER — SODIUM CHLORIDE (PF) 0.9 % IJ SOLN
INTRAMUSCULAR | Status: AC
  Filled 2021-02-20: qty 10

## 2021-02-20 MED ORDER — SODIUM CHLORIDE 0.9 % IV SOLN
INTRAVENOUS | Status: AC | PRN
  Administered 2021-02-20: 300 mL via INTRAMUSCULAR

## 2021-02-20 MED ORDER — CHLORHEXIDINE GLUCONATE CLOTH 2 % EX PADS: 6.0000 | MEDICATED_PAD | Freq: Once | CUTANEOUS | Status: DC

## 2021-02-20 MED ORDER — TRAMADOL HCL 50 MG PO TABS
50.0000 mg | ORAL_TABLET | Freq: Three times a day (TID) | ORAL | Status: DC | PRN
  Administered 2021-02-20: 50 mg via ORAL
  Filled 2021-02-20: qty 1

## 2021-02-20 MED ORDER — METHYLENE BLUE 0.5 % INJ SOLN
INTRAVENOUS | Status: AC
  Filled 2021-02-20: qty 10

## 2021-02-20 MED ORDER — SODIUM CHLORIDE 0.9 % IV SOLN
INTRAVENOUS | Status: AC
  Filled 2021-02-20: qty 10

## 2021-02-20 MED ORDER — PROPOFOL 10 MG/ML IV BOLUS
INTRAVENOUS | Status: DC | PRN
  Administered 2021-02-20: 200 mg via INTRAVENOUS

## 2021-02-20 MED ORDER — FENTANYL CITRATE (PF) 100 MCG/2ML IJ SOLN
INTRAMUSCULAR | Status: DC | PRN
  Administered 2021-02-20 (×2): 25 ug via INTRAVENOUS
  Administered 2021-02-20: 50 ug via INTRAVENOUS

## 2021-02-20 MED ORDER — BISACODYL 10 MG RE SUPP: 10.0000 mg | Freq: Every day | RECTAL | Status: DC | PRN

## 2021-02-20 MED ORDER — IBUPROFEN 600 MG PO TABS
600.0000 mg | ORAL_TABLET | Freq: Four times a day (QID) | ORAL | Status: DC
  Administered 2021-02-20 – 2021-02-21 (×4): 600 mg via ORAL
  Filled 2021-02-20 (×4): qty 1

## 2021-02-20 MED ORDER — DIPHENHYDRAMINE HCL 12.5 MG/5ML PO ELIX: 12.5000 mg | ORAL_SOLUTION | Freq: Four times a day (QID) | ORAL | Status: DC | PRN

## 2021-02-20 MED ORDER — DIPHENHYDRAMINE HCL 50 MG/ML IJ SOLN: 12.5000 mg | Freq: Four times a day (QID) | INTRAMUSCULAR | Status: DC | PRN

## 2021-02-20 MED ORDER — FENTANYL CITRATE (PF) 100 MCG/2ML IJ SOLN
50.0000 ug | Freq: Once | INTRAMUSCULAR | Status: AC
  Administered 2021-02-20: 50 ug via INTRAVENOUS

## 2021-02-20 MED ORDER — MEPERIDINE HCL 25 MG/ML IJ SOLN
6.2500 mg | INTRAMUSCULAR | Status: DC | PRN
Start: 2021-02-20 — End: 2021-02-21

## 2021-02-20 MED ORDER — CEFAZOLIN SODIUM-DEXTROSE 2-4 GM/100ML-% IV SOLN
INTRAVENOUS | Status: AC
  Filled 2021-02-20: qty 100

## 2021-02-20 MED ORDER — BUPIVACAINE HCL (PF) 0.25 % IJ SOLN
INTRAMUSCULAR | Status: DC | PRN
  Administered 2021-02-20: 40 mL

## 2021-02-20 MED ORDER — DIAZEPAM 2 MG PO TABS
2.0000 mg | ORAL_TABLET | Freq: Two times a day (BID) | ORAL | Status: DC | PRN
  Administered 2021-02-20: 2 mg via ORAL
  Filled 2021-02-20: qty 1

## 2021-02-20 MED ORDER — HYDROCODONE-ACETAMINOPHEN 5-325 MG PO TABS
1.0000 | ORAL_TABLET | ORAL | Status: DC | PRN
Start: 2021-02-20 — End: 2021-02-20

## 2021-02-20 MED ORDER — SENNA 8.6 MG PO TABS: 1.0000 | ORAL_TABLET | Freq: Two times a day (BID) | ORAL | Status: DC

## 2021-02-20 MED ORDER — FENTANYL CITRATE (PF) 100 MCG/2ML IJ SOLN: 12.5000 ug | INTRAMUSCULAR | Status: DC | PRN

## 2021-02-20 MED ORDER — ZOLPIDEM TARTRATE 5 MG PO TABS
5.0000 mg | ORAL_TABLET | Freq: Every evening | ORAL | Status: DC | PRN
  Administered 2021-02-20: 5 mg via ORAL
  Filled 2021-02-20: qty 1

## 2021-02-20 MED ORDER — HYDROMORPHONE HCL 1 MG/ML IJ SOLN
0.2500 mg | INTRAMUSCULAR | Status: DC | PRN
  Administered 2021-02-20 (×3): 0.5 mg via INTRAVENOUS

## 2021-02-20 MED ORDER — SUGAMMADEX SODIUM 200 MG/2ML IV SOLN
INTRAVENOUS | Status: DC | PRN
  Administered 2021-02-20: 200 mg via INTRAVENOUS

## 2021-02-20 MED ORDER — TECHNETIUM TC 99M TILMANOCEPT KIT
1.0000 | PACK | Freq: Once | INTRAVENOUS | Status: AC | PRN
  Administered 2021-02-20: 1 via INTRADERMAL

## 2021-02-20 MED ORDER — GABAPENTIN 300 MG PO CAPS
ORAL_CAPSULE | ORAL | Status: AC
  Filled 2021-02-20: qty 1

## 2021-02-20 MED ORDER — ONDANSETRON 4 MG PO TBDP: 4.0000 mg | ORAL_TABLET | Freq: Four times a day (QID) | ORAL | Status: DC | PRN

## 2021-02-20 MED ORDER — SODIUM CHLORIDE 0.9 % IV SOLN
INTRAVENOUS | Status: DC | PRN
  Administered 2021-02-20: 500 mL

## 2021-02-20 MED ORDER — PROMETHAZINE HCL 25 MG/ML IJ SOLN: 6.2500 mg | INTRAMUSCULAR | Status: DC | PRN

## 2021-02-20 MED ORDER — AMISULPRIDE (ANTIEMETIC) 5 MG/2ML IV SOLN
10.0000 mg | Freq: Once | INTRAVENOUS | Status: AC | PRN
  Administered 2021-02-20: 10 mg via INTRAVENOUS

## 2021-02-20 MED ORDER — ROCURONIUM BROMIDE 100 MG/10ML IV SOLN
INTRAVENOUS | Status: DC | PRN
  Administered 2021-02-20: 20 mg via INTRAVENOUS
  Administered 2021-02-20: 60 mg via INTRAVENOUS

## 2021-02-20 MED ORDER — PHENYLEPHRINE HCL (PRESSORS) 10 MG/ML IV SOLN
INTRAVENOUS | Status: DC | PRN
  Administered 2021-02-20: 40 ug via INTRAVENOUS
  Administered 2021-02-20: 80 ug via INTRAVENOUS

## 2021-02-20 MED ORDER — CELECOXIB 200 MG PO CAPS
200.0000 mg | ORAL_CAPSULE | ORAL | Status: AC
  Administered 2021-02-20: 200 mg via ORAL

## 2021-02-20 MED ORDER — POLYETHYLENE GLYCOL 3350 17 G PO PACK: 17.0000 g | PACK | Freq: Every day | ORAL | Status: DC | PRN

## 2021-02-20 MED ORDER — DEXAMETHASONE SODIUM PHOSPHATE 4 MG/ML IJ SOLN
INTRAMUSCULAR | Status: DC | PRN
  Administered 2021-02-20: 10 mg via INTRAVENOUS

## 2021-02-20 MED ORDER — ONDANSETRON HCL 4 MG/2ML IJ SOLN
INTRAMUSCULAR | Status: DC | PRN
  Administered 2021-02-20: 4 mg via INTRAVENOUS

## 2021-02-20 MED ORDER — LACTATED RINGERS IV SOLN: INTRAVENOUS | Status: DC

## 2021-02-20 MED ORDER — 0.9 % SODIUM CHLORIDE (POUR BTL) OPTIME
TOPICAL | Status: DC | PRN
  Administered 2021-02-20: 300 mL

## 2021-02-20 MED ORDER — CEFAZOLIN SODIUM-DEXTROSE 2-4 GM/100ML-% IV SOLN: 2.0000 g | INTRAVENOUS | Status: AC

## 2021-02-20 MED ORDER — LIDOCAINE HCL (CARDIAC) PF 100 MG/5ML IV SOSY
PREFILLED_SYRINGE | INTRAVENOUS | Status: DC | PRN
  Administered 2021-02-20: 30 mg via INTRAVENOUS

## 2021-02-20 MED ORDER — CEFAZOLIN SODIUM-DEXTROSE 2-4 GM/100ML-% IV SOLN
2.0000 g | INTRAVENOUS | Status: AC
  Administered 2021-02-20: 2 g via INTRAVENOUS

## 2021-02-20 MED ORDER — ACETAMINOPHEN 325 MG PO TABS
650.0000 mg | ORAL_TABLET | ORAL | Status: DC | PRN
  Administered 2021-02-20: 650 mg via ORAL
  Filled 2021-02-20: qty 2

## 2021-02-20 MED ORDER — MIDAZOLAM HCL 2 MG/2ML IJ SOLN
INTRAMUSCULAR | Status: AC
  Filled 2021-02-20: qty 2

## 2021-02-20 MED ORDER — ONDANSETRON HCL 4 MG/2ML IJ SOLN: 4.0000 mg | Freq: Four times a day (QID) | INTRAMUSCULAR | Status: DC | PRN

## 2021-02-20 MED ORDER — MIDAZOLAM HCL 2 MG/2ML IJ SOLN
2.0000 mg | Freq: Once | INTRAMUSCULAR | Status: AC
  Administered 2021-02-20: 2 mg via INTRAVENOUS

## 2021-02-20 MED ORDER — MIDAZOLAM HCL 5 MG/5ML IJ SOLN
INTRAMUSCULAR | Status: DC | PRN
  Administered 2021-02-20: 2 mg via INTRAVENOUS

## 2021-02-20 MED ORDER — PROPOFOL 500 MG/50ML IV EMUL
INTRAVENOUS | Status: DC | PRN
  Administered 2021-02-20: 25 ug/kg/min via INTRAVENOUS

## 2021-02-20 MED ORDER — GABAPENTIN 300 MG PO CAPS
300.0000 mg | ORAL_CAPSULE | ORAL | Status: AC
  Administered 2021-02-20: 300 mg via ORAL

## 2021-02-20 MED ORDER — FENTANYL CITRATE (PF) 100 MCG/2ML IJ SOLN
INTRAMUSCULAR | Status: AC
  Filled 2021-02-20: qty 2

## 2021-02-20 MED ORDER — BUPIVACAINE LIPOSOME 1.3 % IJ SUSP
INTRAMUSCULAR | Status: DC | PRN
  Administered 2021-02-20: 20 mL

## 2021-02-20 MED ORDER — CEFAZOLIN SODIUM-DEXTROSE 2-4 GM/100ML-% IV SOLN
2.0000 g | Freq: Three times a day (TID) | INTRAVENOUS | Status: DC
  Administered 2021-02-20 – 2021-02-21 (×2): 2 g via INTRAVENOUS
  Filled 2021-02-20 (×2): qty 100

## 2021-02-20 SURGICAL SUPPLY — 90 items
APPLIER CLIP 9.375 MED OPEN (MISCELLANEOUS) ×4
BAG DECANTER FOR FLEXI CONT (MISCELLANEOUS) ×4 IMPLANT
BINDER BREAST LRG (GAUZE/BANDAGES/DRESSINGS) IMPLANT
BINDER BREAST MEDIUM (GAUZE/BANDAGES/DRESSINGS) IMPLANT
BINDER BREAST XLRG (GAUZE/BANDAGES/DRESSINGS) ×4 IMPLANT
BINDER BREAST XXLRG (GAUZE/BANDAGES/DRESSINGS) IMPLANT
BIOPATCH RED 1 DISK 7.0 (GAUZE/BANDAGES/DRESSINGS) ×8 IMPLANT
BLADE HEX COATED 2.75 (ELECTRODE) IMPLANT
BLADE SURG 10 STRL SS (BLADE) ×8 IMPLANT
BLADE SURG 15 STRL LF DISP TIS (BLADE) ×3 IMPLANT
BLADE SURG 15 STRL SS (BLADE) ×1
BNDG GAUZE ELAST 4 BULKY (GAUZE/BANDAGES/DRESSINGS) IMPLANT
CANISTER SUCT 1200ML W/VALVE (MISCELLANEOUS) ×4 IMPLANT
CHLORAPREP W/TINT 26 (MISCELLANEOUS) ×8 IMPLANT
CLIP APPLIE 9.375 MED OPEN (MISCELLANEOUS) ×3 IMPLANT
COVER BACK TABLE 60X90IN (DRAPES) ×4 IMPLANT
COVER MAYO STAND STRL (DRAPES) ×8 IMPLANT
COVER PROBE W GEL 5X96 (DRAPES) ×4 IMPLANT
DECANTER SPIKE VIAL GLASS SM (MISCELLANEOUS) IMPLANT
DERMABOND ADVANCED (GAUZE/BANDAGES/DRESSINGS) ×2
DERMABOND ADVANCED .7 DNX12 (GAUZE/BANDAGES/DRESSINGS) ×6 IMPLANT
DEVICE DSSCT PLSMBLD 3.0S LGHT (MISCELLANEOUS) ×3 IMPLANT
DRAIN CHANNEL 19F RND (DRAIN) ×8 IMPLANT
DRAPE LAPAROSCOPIC ABDOMINAL (DRAPES) ×4 IMPLANT
DRAPE UTILITY XL STRL (DRAPES) ×4 IMPLANT
DRSG OPSITE POSTOP 4X6 (GAUZE/BANDAGES/DRESSINGS) ×8 IMPLANT
DRSG PAD ABDOMINAL 8X10 ST (GAUZE/BANDAGES/DRESSINGS) ×8 IMPLANT
DRSG TEGADERM 2-3/8X2-3/4 SM (GAUZE/BANDAGES/DRESSINGS) ×8 IMPLANT
ELECT BLADE 4.0 EZ CLEAN MEGAD (MISCELLANEOUS) ×4
ELECT COATED BLADE 2.86 ST (ELECTRODE) ×4 IMPLANT
ELECT REM PT RETURN 9FT ADLT (ELECTROSURGICAL) ×4
ELECTRODE BLDE 4.0 EZ CLN MEGD (MISCELLANEOUS) ×3 IMPLANT
ELECTRODE REM PT RTRN 9FT ADLT (ELECTROSURGICAL) ×3 IMPLANT
EVACUATOR SILICONE 100CC (DRAIN) ×8 IMPLANT
FUNNEL KELLER 2 DISP (MISCELLANEOUS) IMPLANT
GAUZE SPONGE 4X4 12PLY STRL LF (GAUZE/BANDAGES/DRESSINGS) ×4 IMPLANT
GLOVE SURG ENC MOIS LTX SZ6 (GLOVE) ×8 IMPLANT
GLOVE SURG ENC MOIS LTX SZ6.5 (GLOVE) IMPLANT
GLOVE SURG ENC MOIS LTX SZ7.5 (GLOVE) ×8 IMPLANT
GLOVE SURG POLYISO LF SZ6.5 (GLOVE) ×8 IMPLANT
GLOVE SURG UNDER POLY LF SZ6.5 (GLOVE) ×4 IMPLANT
GLOVE SURG UNDER POLY LF SZ7 (GLOVE) ×4 IMPLANT
GOWN STRL REUS W/ TWL LRG LVL3 (GOWN DISPOSABLE) ×9 IMPLANT
GOWN STRL REUS W/ TWL XL LVL3 (GOWN DISPOSABLE) ×3 IMPLANT
GOWN STRL REUS W/TWL LRG LVL3 (GOWN DISPOSABLE) ×3
GOWN STRL REUS W/TWL XL LVL3 (GOWN DISPOSABLE) ×1
GRAFT FLEX HD 6X16 PLIABLE (Tissue) ×8 IMPLANT
ILLUMINATOR WAVEGUIDE N/F (MISCELLANEOUS) IMPLANT
IMPL EXPANDER BREAST 535CC (Breast) ×6 IMPLANT
IMPLANT BREAST 535CC (Breast) ×2 IMPLANT
IMPLANT EXPANDER BREAST 535CC (Breast) ×6 IMPLANT
IV NS 1000ML (IV SOLUTION) ×1
IV NS 1000ML BAXH (IV SOLUTION) ×3 IMPLANT
IV NS 500ML (IV SOLUTION)
IV NS 500ML BAXH (IV SOLUTION) IMPLANT
KIT FILL ASEPTIC TRANSFER (MISCELLANEOUS) ×8 IMPLANT
KIT FILL SYSTEM UNIVERSAL (SET/KITS/TRAYS/PACK) ×4 IMPLANT
LIGHT WAVEGUIDE WIDE FLAT (MISCELLANEOUS) IMPLANT
NEEDLE HYPO 25X1 1.5 SAFETY (NEEDLE) IMPLANT
NS IRRIG 1000ML POUR BTL (IV SOLUTION) ×4 IMPLANT
PACK BASIN DAY SURGERY FS (CUSTOM PROCEDURE TRAY) ×4 IMPLANT
PAD FOAM SILICONE BACKED (GAUZE/BANDAGES/DRESSINGS) IMPLANT
PENCIL SMOKE EVACUATOR (MISCELLANEOUS) IMPLANT
PIN SAFETY STERILE (MISCELLANEOUS) ×4 IMPLANT
PLASMABLADE 3.0S W/LIGHT (MISCELLANEOUS) ×4
SLEEVE SCD COMPRESS KNEE MED (STOCKING) ×4 IMPLANT
SPONGE T-LAP 18X18 ~~LOC~~+RFID (SPONGE) ×12 IMPLANT
STRIP SUTURE WOUND CLOSURE 1/2 (MISCELLANEOUS) IMPLANT
SUT ETHILON 2 0 FS 18 (SUTURE) IMPLANT
SUT MNCRL AB 4-0 PS2 18 (SUTURE) ×8 IMPLANT
SUT MON AB 3-0 SH 27 (SUTURE) ×3
SUT MON AB 3-0 SH27 (SUTURE) ×9 IMPLANT
SUT MON AB 5-0 PS2 18 (SUTURE) ×12 IMPLANT
SUT PDS 3-0 CT2 (SUTURE)
SUT PDS AB 2-0 CT2 27 (SUTURE) ×36 IMPLANT
SUT PDS II 3-0 CT2 27 ABS (SUTURE) IMPLANT
SUT SILK 2 0 PERMA HAND 18 BK (SUTURE) ×4 IMPLANT
SUT SILK 2 0 SH (SUTURE) IMPLANT
SUT SILK 3 0 PS 1 (SUTURE) ×8 IMPLANT
SUT VIC AB 3-0 SH 27 (SUTURE)
SUT VIC AB 3-0 SH 27X BRD (SUTURE) IMPLANT
SUT VICRYL 3-0 CR8 SH (SUTURE) ×4 IMPLANT
SYR BULB IRRIG 60ML STRL (SYRINGE) ×8 IMPLANT
SYR CONTROL 10ML LL (SYRINGE) IMPLANT
TOWEL GREEN STERILE FF (TOWEL DISPOSABLE) ×8 IMPLANT
TRAY DSU PREP LF (CUSTOM PROCEDURE TRAY) IMPLANT
TRAY FOLEY W/BAG SLVR 14FR LF (SET/KITS/TRAYS/PACK) ×4 IMPLANT
TUBE CONNECTING 20X1/4 (TUBING) ×4 IMPLANT
UNDERPAD 30X36 HEAVY ABSORB (UNDERPADS AND DIAPERS) ×8 IMPLANT
YANKAUER SUCT BULB TIP NO VENT (SUCTIONS) ×4 IMPLANT

## 2021-02-20 NOTE — Progress Notes (Signed)
Nuclear medicine at bedside. RN remained with patient and provided emotional support. Vital signs remained stable. Patient tolerated well.

## 2021-02-20 NOTE — Discharge Instructions (Addendum)
[facility] Instructions   If you had a scopolamine patch placed behind your ear for the management of post- operative nausea and/or vomiting:  1. The medication in the patch is effective for 72 hours, after which it should be removed.  Wrap patch in a tissue and discard in the trash. Wash hands thoroughly with soap and water. 2. You may remove the patch earlier than 72 hours if you experience unpleasant side effects which may include dry mouth, dizziness or visual disturbances. 3. Avoid touching the patch. Wash your hands with soap and water after contact with the patch.      Information for Discharge Teaching: EXPAREL (bupivacaine liposome injectable suspension)   Your surgeon or anesthesiologist gave you EXPAREL(bupivacaine) to help control your pain after surgery.  EXPAREL is a local anesthetic that provides pain relief by numbing the tissue around the surgical site. EXPAREL is designed to release pain medication over time and can control pain for up to 72 hours. Depending on how you respond to EXPAREL, you may require less pain medication during your recovery.  Possible side effects: Temporary loss of sensation or ability to move in the area where bupivacaine was injected. Nausea, vomiting, constipation Rarely, numbness and tingling in your mouth or lips, lightheadedness, or anxiety may occur. Call your doctor right away if you think you may be experiencing any of these sensations, or if you have other questions regarding possible side effects.  Follow all other discharge instructions given to you by your surgeon or nurse. Eat a healthy diet and drink plenty of water or other fluids.  If you return to the hospital for any reason within 96 hours following the administration of EXPAREL, it is important for health care providers to know that you have received this anesthetic. A teal colored band has been placed on your arm with the date, time and amount of EXPAREL you have  received in order to alert and inform your health care providers. Please leave this armband in place for the full 96 hours following administration, and then you may remove the band.       [facility]                      How to care for your drainage and suction unit at home Your drainage catheter will be connected to a Jackson-Pratt (JP) bulb that is a round and soft collection device.   The vacuum created when the device is compressed allows drainage to collect in the container.    Attach the JP bulb to your clothes with a safety pin to prevent tension on your skin and help prevent the tube from dislodging prematurely.    To compress the Jackson-Pratt Bulb:  Release the plug at the top of the bulb (emptying port) Squeeze the bulb tightly in your fist, squeezing air out of the bulb Replace the plug while the bulb is compressed Be careful not to spill the contents when squeezing the bulb  *The bulb must be compressed to have suction and initiate drainage *  To empty the collection device:   Release the plug on the top of the collection unit (emptying port) Pour the fluid into a measuring container such as a plastic medicine cup Record the date and the amount of drainage emptied.  This should be done at routine times every day      Please bring this record with you to each office visit Date Amount of  Drainage                                                            Contact your physician if any of the following occur: Drain or incisional area becomes red, swollen or hot to the touch. You develop a fever greater than 100 degrees F. Oozing at the skin insertion site - you may apply a gauze dressing. Drain falls out - apply gauze dressing over the drain insertion site. Drainage is increasing daily unrelated to activity pattern.  Note: You will usually have more drainage as your activity increases.

## 2021-02-20 NOTE — Progress Notes (Addendum)
Assisted Dr. Elgie Congo with right, left, ultrasound guided, pectoralis block. Side rails up, monitors on throughout procedure. See vital signs in flow sheet. Tolerated Procedure well.

## 2021-02-20 NOTE — Op Note (Signed)
Op report    DATE OF OPERATION:  02/20/2021  LOCATION: [place]  SURGICAL DIVISION: Plastic Surgery  PREOPERATIVE DIAGNOSES:  1. Left Breast cancer.    POSTOPERATIVE DIAGNOSES:  1. Left Breast cancer.   PROCEDURE:  1. Bilateral immediate breast reconstruction with placement of Acellular Dermal Matrix and tissue expanders.  SURGEON: Aalijah Lanphere Sanger Analysia Dungee, DO  ASSISTANT: Roetta Sessions, PA  ANESTHESIA:  General.   COMPLICATIONS: None.   IMPLANTS: Left - Mentor 535 cc. Ref #[id number].  Serial Number [id number], 150 cc of injectable saline placed in the expander. Right - Mentor 535 cc. Ref #[id number].  Serial Number [id number], 150 cc of injectable saline placed in the expander. Acellular Dermal Matrix 6 x 16 cm Flex HD bilaterally  INDICATIONS FOR PROCEDURE:  The patient, Ebony Watson, is a 40 y.o. female born on 12-Nov-1980, is here for  immediate first stage breast reconstruction with placement of bilateral tissue expander and Acellular dermal matrix. MRN: [id number]  CONSENT:  Informed consent was obtained directly from the patient. Risks, benefits and alternatives were fully discussed. Specific risks including but not limited to bleeding, infection, hematoma, seroma, scarring, pain, implant infection, implant extrusion, capsular contracture, asymmetry, wound healing problems, and need for further surgery were all discussed. The patient did have an ample opportunity to have her questions answered to her satisfaction.   DESCRIPTION OF PROCEDURE:  The patient was taken to the operating room by the general surgery team. SCDs were placed and IV antibiotics were given. The patient's chest was prepped and draped in a sterile fashion. A time out was performed and the implants to be used were identified.  Bilateral mastectomies were performed.  Once the general surgery team had completed their portion of the case the patient was rendered to the plastic  and reconstructive surgery team.  Right:  The pectoralis major muscle was lifted from the chest wall with release of the lateral edge and lateral inframammary fold.  The pocket was irrigated with antibiotic solution and hemostasis was achieved with electrocautery.  The ADM was then prepared according to the manufacture guidelines and slits placed to help with postoperative fluid management.  The ADM was then sutured to the inferior and lateral edge of the inframammary fold with 2-0 PDS starting with an interrupted stitch and then a running stitch.  The lateral portion was sutured to with interrupted sutures after the expander was placed.  The expander was prepared according to the manufacture guidelines, the air evacuated and then it was placed under the ADM and pectoralis major muscle.  The inferior and lateral tabs were used to secure the expander to the chest wall with 2-0 PDS.  The drain was placed at the inframammary fold over the ADM and secured to the skin with 3-0 Silk.  The deep layers were closed with 3-0 Monocryl followed by 4-0 Monocryl.  The skin was closed with 5-0 Monocryl and then dermabond was applied.    Left:  The pectoralis major muscle was lifted from the chest wall with release of the lateral edge and lateral inframammary fold.  The pocket was irrigated with antibiotic solution and hemostasis was achieved with electrocautery.  The ADM was then prepared according to the manufacture guidelines and slits placed to help with postoperative fluid management.  The ADM was then sutured to the inferior and lateral edge of the inframammary fold with 2-0 PDS starting with an interrupted stitch and then a running stitch.  The lateral portion was sutured to with  interrupted sutures after the expander was placed.  The pectoralis muscle tore from the rectus muscle.  This was repaired with the 3-0 Vicryl suture.  The expander was prepared according to the manufacture guidelines, the air evacuated and then  it was placed under the ADM and pectoralis major muscle.  The inferior and lateral tabs were used to secure the expander to the chest wall with 2-0 PDS.  The drain was placed at the inframammary fold over the ADM and secured to the skin with 3-0 Silk.  The deep layers were closed with 3-0 Monocryl followed by 4-0 Monocryl.  The skin was closed with 5-0 Monocryl and then dermabond was applied.   The ABDs and breast binder were placed.  The patient tolerated the procedure well and there were no complications.  The patient was allowed to wake from anesthesia and taken to the recovery room in satisfactory condition.   The advanced practice practitioner (APP) assisted throughout the case.  The APP was essential in retraction and counter traction when needed to make the case progress smoothly.  This retraction and assistance made it possible to see the tissue plans for the procedure.  The assistance was needed for blood control, tissue re-approximation and assisted with closure of the incision site.

## 2021-02-20 NOTE — Op Note (Signed)
02/20/2021  11:01 AM  PATIENT:  Ebony Watson  40 y.o. female  PRE-OPERATIVE DIAGNOSIS:  LEFT BREAST DCIS BRCA TWO POSITIVE  POST-OPERATIVE DIAGNOSIS:  LEFT BREAST DCIS BRCA TWO POSITIVE  PROCEDURE:  Procedure(s): LEFT MASTECTOMY WITH SENTINEL LYMPH NODE BIOPSY (Left) RIGHT SIMPLE MASTECTOMY (Right)   SURGEON:  Surgeon(s) and Role:  Jovita Kussmaul, MD - Primary   PHYSICIAN ASSISTANT:   ASSISTANTS: none   ANESTHESIA:   general  EBL:  50 mL   BLOOD ADMINISTERED:none  DRAINS: none   LOCAL MEDICATIONS USED:  NONE  SPECIMEN:  Source of Specimen:  LEFT MASTECTOMY AND SENTINEL NODE, RIGHT MASTECTOMY  DISPOSITION OF SPECIMEN:  PATHOLOGY  COUNTS:  YES  TOURNIQUET:  * No tourniquets in log *  DICTATION: .Dragon Dictation  After informed consent was obtained the patient was brought to the operating room and placed in the supine position on the operating table.  After adequate induction of general anesthesia the patient's bilateral chest, breast, and axillary areas were prepped with ChloraPrep, allowed to dry, and draped in usual sterile manner.  An appropriate timeout was performed.  Attention was first turned to the left breast.  Earlier in the day the patient underwent injection of 1 mCi of technetium sulfur colloid in the subareolar position on the left.  The neoprobe was set to technetium and an area of radioactivity was readily identified in the left axilla.  An elliptical incision was then made around the nipple and areola complex in order to spare some of the skin.  The incision was carried through the skin and subcutaneous tissue sharply with the PlasmaBlade.  Breast hooks were used to elevate the skin flaps anteriorly towards the ceiling.  Thin skin flaps were then created circumferentially by dissecting between the breast tissue and the subcutaneous fat and skin.  This dissection was carried all the way to the chest wall circumferentially.  Next the breast was  removed from the pectoralis muscle with the pectoralis fascia sharply with the PlasmaBlade.  Once this was accomplished the entire left breast was removed.  It was oriented with a stitch on the lateral skin and sent to pathology for further evaluation.  The neoprobe was then used to direct sharp dissection with the PlasmaBlade into the deep left axillary space.  I was able to identify a hot lymph node.  This lymph node was excised sharply with the PlasmaBlade and the surrounding small vessels and lymphatics were controlled with clips.  Ex vivo counts on this node were approximately 350.  There may have been 2 or 3 nodes with this node.  No other hot or palpable nodes were identified in the left axilla.  Hemostasis was achieved using the PlasmaBlade.  The wound was irrigated with saline and packed with a moistened lap sponge.  Attention was then turned to the right breast.  A similar elliptical incision was made around the nipple and areola complex with a 10 blade knife.  The incision was carried through the skin and subcutaneous tissue sharply with the PlasmaBlade.  Breast hooks were used to elevate the skin flaps anteriorly towards the ceiling.  Thin skin flaps were then created by dissecting between the breast tissue and the subcutaneous fat and skin.  This dissection was carried all the way to the chest wall circumferentially.  Next the breast was removed from the pectoralis muscle with the pectoralis fashion as sharply with the PlasmaBlade.  Once this was accomplished the entire right breast was removed.  It  was oriented with a stitch on the lateral skin and sent to pathology for further evaluation.  Hemostasis was achieved using the PlasmaBlade.  The patient tolerated the procedure well.  At this point all needle sponge and instrument counts were correct.  The wounds were packed with moistened lap sponges.  At this point the operation was turned over to Dr. Dillingham for the reconstruction.  Her portion will  be dictated separately.  PLAN OF CARE: Admit for overnight observation  PATIENT DISPOSITION:  PACU - hemodynamically stable.   Delay start of Pharmacological VTE agent (>24hrs) due to surgical blood loss or risk of bleeding: no  

## 2021-02-20 NOTE — Transfer of Care (Signed)
Immediate Anesthesia Transfer of Care Note  Patient: Ebony Watson  Procedure(s) Performed: LEFT MASTECTOMY WITH SENTINEL LYMPH NODE BIOPSY (Left: Breast) RIGHT TOTAL MASTECTOMY (Right: Breast) BILATERAL IMMEDIATE BREAST RECONSTRUCTION WITH PLACEMENT OF TISSUE EXPANDER AND FLEX HD (ACELLULAR HYDRATED DERMIS) (Bilateral: Chest)  Patient Location: PACU  Anesthesia Type:GA combined with regional for post-op pain  Level of Consciousness: awake  Airway & Oxygen Therapy: Patient Spontanous Breathing and Patient connected to face mask oxygen  Post-op Assessment: Report given to RN and Post -op Vital signs reviewed and stable  Post vital signs: Reviewed and stable  Last Vitals:  Vitals Value Taken Time  BP 118/69 02/20/21 1230  Temp    Pulse 80 02/20/21 1231  Resp 11 02/20/21 1231  SpO2 98 % 02/20/21 1231  Vitals shown include unvalidated device data.  Last Pain:  Vitals:   02/20/21 0709  TempSrc: Oral  PainSc: 0-No pain         Complications: No notable events documented.

## 2021-02-20 NOTE — Interval H&P Note (Signed)
History and Physical Interval Note:  02/20/2021 12:27 PM  Ebony Watson  has presented today for surgery, with the diagnosis of LEFT BREAST DCIS BRCA TWO POSITIVE.  The various methods of treatment have been discussed with the patient and family. After consideration of risks, benefits and other options for treatment, the patient has consented to  Procedure(s): LEFT MASTECTOMY WITH SENTINEL LYMPH NODE BIOPSY (Left) RIGHT TOTAL MASTECTOMY (Right) BILATERAL IMMEDIATE BREAST RECONSTRUCTION WITH PLACEMENT OF TISSUE EXPANDER AND FLEX HD (ACELLULAR HYDRATED DERMIS) (Bilateral) as a surgical intervention.  The patient's history has been reviewed, patient examined, no change in status, stable for surgery.  I have reviewed the patient's chart and labs.  Questions were answered to the patient's satisfaction.     Loel Lofty [name]

## 2021-02-20 NOTE — H&P (Signed)
Ebony Watson  Location: [facility] Patient #: [id number] DOB: 01/10/1981 Divorced / Language: English / Race: White Female   History of Present Illness  The patient is a 40 year old female who presents with breast cancer. We are asked to see the patient in consultation by Dr. Iruku to evaluate her for a new left breast cancer.  The patient is a 40-year-old white female who has been getting six-month screenings because she is BRCA2 positive.  Most recently her imaging showed a 9 cm area of enhancement in the inner left breast.  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  There was one other area of enhancement on the left and 2 on the right that were biopsied and benign.  She does have a significant family history of breast cancer.  She is otherwise healthy and does not smoke.   Past Surgical History Breast Biopsy   Bilateral.  Diagnostic Studies History  Colonoscopy   never  Allergies  oxyCODONE HCl *ANALGESICS - OPIOID*   Norco *ANALGESICS - OPIOID*    Medication History Advair Diskus  (250-50MCG/DOSE Aero Pow Br Act, Inhalation) Active. Albuterol Sulfate HFA  (108 (90 Base)MCG/ACT Aerosol Soln, Inhalation) Active. Omega 3  (Oral) Specific strength unknown - Active. Vitamin D (Cholecalciferol)  (Oral) Specific strength unknown - Active. Claritin  (10MG Capsule, Oral) Active. Medications Reconciled   Social History  Alcohol use   Occasional alcohol use. Caffeine use   Coffee. No drug use   Tobacco use   Former smoker.  Family History  Alcohol Abuse   Father, Mother, Sister. Breast Cancer   Family Members In General. Cervical Cancer   Sister. Depression   Mother. Heart Disease   Father. Hypertension   Mother. Migraine Headache   Mother, Son.  Pregnancy / Birth History Age of menopause   <46 Contraceptive History   Oral contraceptives. Regular periods    Other Problems  Anxiety Disorder   Asthma   Breast  Cancer   Depression   Hypercholesterolemia   Lump In Breast   Oophorectomy      Review of Systems  General Not Present- Appetite Loss, Chills, Fatigue, Fever, Night Sweats, Weight Gain and Weight Loss. Skin Not Present- Change in Wart/Mole, Dryness, Hives, Jaundice, New Lesions, Non-Healing Wounds, Rash and Ulcer. HEENT Present- Seasonal Allergies. Not Present- Earache, Hearing Loss, Hoarseness, Nose Bleed, Oral Ulcers, Ringing in the Ears, Sinus Pain, Sore Throat, Visual Disturbances, Wears glasses/contact lenses and Yellow Eyes. Respiratory Not Present- Bloody sputum, Chronic Cough, Difficulty Breathing, Snoring and Wheezing. Breast Present- Breast Mass. Not Present- Breast Pain, Nipple Discharge and Skin Changes. Gastrointestinal Not Present- Abdominal Pain, Bloating, Bloody Stool, Change in Bowel Habits, Chronic diarrhea, Constipation, Difficulty Swallowing, Excessive gas, Gets full quickly at meals, Hemorrhoids, Indigestion, Nausea, Rectal Pain and Vomiting. Female Genitourinary Not Present- Frequency, Nocturia, Painful Urination, Pelvic Pain and Urgency. Musculoskeletal Not Present- Back Pain, Joint Pain, Joint Stiffness, Muscle Pain, Muscle Weakness and Swelling of Extremities. Endocrine Not Present- Cold Intolerance, Excessive Hunger, Hair Changes, Heat Intolerance, Hot flashes and New Diabetes. Hematology Not Present- Blood Thinners, Easy Bruising, Excessive bleeding, Gland problems, HIV and Persistent Infections.  Vitals  Weight: 186.38 lb   Height: 67 in  Body Surface Area: 1.96 m   Body Mass Index: 29.19 kg/m   Pulse: 90 (Regular)    BP: 128/86(Sitting, Left Arm, Standard)       Physical Exam  General Mental Status - Alert. General Appearance -   Consistent with stated age. Hydration - Well hydrated. Voice - Normal.  Head and Neck Head - normocephalic, atraumatic with no lesions or palpable masses. Trachea - midline. Thyroid Gland Characteristics - normal size  and consistency.  Eye Eyeball - Bilateral - Extraocular movements intact. Sclera/Conjunctiva - Bilateral - No scleral icterus.  Chest and Lung Exam Chest and lung exam reveals  - quiet, even and easy respiratory effort with no use of accessory muscles and on auscultation, normal breath sounds, no adventitious sounds and normal vocal resonance. Inspection Chest Wall - Normal. Back - normal.  Breast Note:  There is significant bruising bilaterally. Other than the bruising there is no palpable mass in either breast. There is no palpable axillary, supraclavicular, or cervical lymphadenopathy.   Cardiovascular Cardiovascular examination reveals  - normal heart sounds, regular rate and rhythm with no murmurs and normal pedal pulses bilaterally.  Abdomen Inspection Inspection of the abdomen reveals - No Hernias. Skin - Scar - no surgical scars. Palpation/Percussion Palpation and Percussion of the abdomen reveal - Soft, Non Tender, No Rebound tenderness, No Rigidity (guarding) and No hepatosplenomegaly. Auscultation Auscultation of the abdomen reveals - Bowel sounds normal.  Neurologic Neurologic evaluation reveals  - alert and oriented x 3 with no impairment of recent or remote memory. Mental Status - Normal.  Musculoskeletal Normal Exam - Left - Upper Extremity Strength Normal and Lower Extremity Strength Normal. Normal Exam - Right - Upper Extremity Strength Normal and Lower Extremity Strength Normal.  Lymphatic Head & Neck  General Head & Neck Lymphatics: Bilateral - Description - Normal. Axillary  General Axillary Region: Bilateral - Description - Normal. Tenderness - Non Tender. Femoral & Inguinal  Generalized Femoral & Inguinal Lymphatics: Bilateral - Description - Normal. Tenderness - Non Tender.    Assessment & Plan  DUCTAL CARCINOMA IN SITU (DCIS) OF LEFT BREAST (D05.12) Impression: The patient appears to have a 9 cm area of ductal carcinoma in situ in the inner  left breast. She is also BRCA2 positive which puts her at much higher risk for recurrence. Because of this we had a long conversation today about the options for treatment and ways to reduce her risk. She has elected for bilateral mastectomies. She would need a sentinel node mapping on the left. She might be a candidate for nipple sparing mastectomy and we will discuss this with the plastic surgeons after she sees them. She is interested in reconstruction. I have discussed with her in detail the risks and benefits of the operation as well as some of the technical aspects and she understands and wishes to proceed. We will coordinate with plastic surgery for bilateral mastectomies with reconstruction. It may take several weeks to get to the operating room so we will ask oncology to also put her on an anti-estrogen in the meantime. This patient encounter took 60 minutes today to perform the following: take history, perform exam, review outside records, interpret imaging, counsel the patient on their diagnosis and document encounter, findings & plan in the EHR Current Plans Referred to Surgery - Plastic, for evaluation and follow up (Plastic Surgery). Routine. Referred to Physical Therapy, for evaluation and follow up (Physical Therapy). Routine. BRCA2 POSITIVE (Z15.01)

## 2021-02-20 NOTE — Anesthesia Postprocedure Evaluation (Signed)
Anesthesia Post Note  Patient: Ebony Watson  Procedure(s) Performed: LEFT MASTECTOMY WITH SENTINEL LYMPH NODE BIOPSY (Left: Breast) RIGHT TOTAL MASTECTOMY (Right: Breast) BILATERAL IMMEDIATE BREAST RECONSTRUCTION WITH PLACEMENT OF TISSUE EXPANDER AND FLEX HD (ACELLULAR HYDRATED DERMIS) (Bilateral: Chest)     Patient location during evaluation: PACU Anesthesia Type: Regional and General Level of consciousness: awake and alert Pain management: pain level controlled Vital Signs Assessment: post-procedure vital signs reviewed and stable Respiratory status: spontaneous breathing, nonlabored ventilation and respiratory function stable Cardiovascular status: blood pressure returned to baseline and stable Postop Assessment: no apparent nausea or vomiting Anesthetic complications: no   No notable events documented.  Last Vitals:  Vitals:   02/20/21 1331 02/20/21 1400  BP: 136/83 127/69  Pulse: 80 84  Resp: 13 16  Temp:  36.6 C  SpO2: 95% 95%    Last Pain:  Vitals:   02/20/21 1400  TempSrc:   PainSc: 3                  Lynda Rainwater

## 2021-02-20 NOTE — Anesthesia Preprocedure Evaluation (Signed)
Anesthesia Evaluation  Patient identified by MRN, date of birth, ID band Patient awake    Reviewed: Allergy & Precautions, NPO status , Patient's Chart, lab work & pertinent test results  Airway Mallampati: II  TM Distance: >3 FB Neck ROM: Full    Dental no notable dental hx.    Pulmonary asthma , former smoker,    Pulmonary exam normal breath sounds clear to auscultation       Cardiovascular negative cardio ROS Normal cardiovascular exam Rhythm:Regular Rate:Normal     Neuro/Psych PSYCHIATRIC DISORDERS Anxiety Depression negative neurological ROS     GI/Hepatic negative GI ROS, Neg liver ROS,   Endo/Other  negative endocrine ROS  Renal/GU negative Renal ROS  negative genitourinary   Musculoskeletal negative musculoskeletal ROS (+)   Abdominal   Peds negative pediatric ROS (+)  Hematology negative hematology ROS (+)   Anesthesia Other Findings   Reproductive/Obstetrics negative OB ROS                             Anesthesia Physical Anesthesia Plan  ASA: 3  Anesthesia Plan: General and Regional   Post-op Pain Management: GA combined w/ Regional for post-op pain   Induction: Intravenous  PONV Risk Score and Plan: 3 and Treatment may vary due to age or medical condition, Ondansetron, Dexamethasone and Midazolam  Airway Management Planned: Oral ETT  Additional Equipment: None  Intra-op Plan:   Post-operative Plan: Extubation in OR  Informed Consent: I have reviewed the patients History and Physical, chart, labs and discussed the procedure including the risks, benefits and alternatives for the proposed anesthesia with the patient or authorized representative who has indicated his/her understanding and acceptance.     Dental advisory given  Plan Discussed with: CRNA and Anesthesiologist  Anesthesia Plan Comments:         Anesthesia Quick Evaluation

## 2021-02-20 NOTE — Anesthesia Procedure Notes (Signed)
Procedure Name: Intubation Date/Time: 02/20/2021 8:48 AM Performed by: Signe Colt, CRNA Pre-anesthesia Checklist: Patient identified, Emergency Drugs available, Suction available and Patient being monitored Patient Re-evaluated:Patient Re-evaluated prior to induction Oxygen Delivery Method: Circle system utilized Preoxygenation: Pre-oxygenation with 100% oxygen Induction Type: IV induction Ventilation: Mask ventilation without difficulty Laryngoscope Size: Mac and 3 Grade View: Grade I Tube type: Oral Tube size: 7.0 mm Number of attempts: 1 Airway Equipment and Method: Stylet and Oral airway Placement Confirmation: ETT inserted through vocal cords under direct vision, positive ETCO2 and breath sounds checked- equal and bilateral Secured at: 21 cm Tube secured with: Tape Dental Injury: Teeth and Oropharynx as per pre-operative assessment

## 2021-02-20 NOTE — Interval H&P Note (Signed)
History and Physical Interval Note:  02/20/2021 8:08 AM  Ebony Watson  has presented today for surgery, with the diagnosis of LEFT BREAST DCIS BRCA TWO POSITIVE.  The various methods of treatment have been discussed with the patient and family. After consideration of risks, benefits and other options for treatment, the patient has consented to  Procedure(s): LEFT MASTECTOMY WITH SENTINEL LYMPH NODE BIOPSY (Left) RIGHT TOTAL MASTECTOMY (Right) BILATERAL IMMEDIATE BREAST RECONSTRUCTION WITH PLACEMENT OF TISSUE EXPANDER AND FLEX HD (ACELLULAR HYDRATED DERMIS) (Bilateral) as a surgical intervention.  The patient's history has been reviewed, patient examined, no change in status, stable for surgery.  I have reviewed the patient's chart and labs.  Questions were answered to the patient's satisfaction.     Autumn Messing III

## 2021-02-21 DIAGNOSIS — D0512 Intraductal carcinoma in situ of left breast: Secondary | ICD-10-CM | POA: Diagnosis not present

## 2021-02-21 NOTE — Addendum Note (Signed)
Addendum  created 02/21/21 0851 by Merlinda Frederick, MD   Child order released for a procedure order, Clinical Note Signed, Intraprocedure Blocks edited, SmartForm saved

## 2021-02-21 NOTE — Anesthesia Procedure Notes (Signed)
Anesthesia Regional Block: Pectoralis block   Pre-Anesthetic Checklist: , timeout performed,  Correct Patient, Correct Site, Correct Laterality,  Correct Procedure, Correct Position, site marked,  Risks and benefits discussed,  Surgical consent,  Pre-op evaluation,  At surgeon's request and post-op pain management  Laterality: Left and Right  Prep: chloraprep       Needles:  Injection technique: Single-shot  Needle Type: Echogenic Stimulator Needle     Needle Length: 10cm  Needle Gauge: 20     Additional Needles:   Procedures:,,,, ultrasound used (permanent image in chart),,    Narrative:  Start time: 02/20/2021 8:00 AM End time: 02/20/2021 8:10 AM Injection made incrementally with aspirations every 5 mL.  Performed by: Personally  Anesthesiologist: Merlinda Frederick, MD  Additional Notes: A functioning IV was confirmed and monitors were applied.  Sterile prep and drape, hand hygiene and sterile gloves were used.  Negative aspiration and test dose prior to incremental administration of local anesthetic. The patient tolerated the procedure well.Ultrasound  guidance: relevant anatomy identified, needle position confirmed, local anesthetic spread visualized around nerve(s), vascular puncture avoided.  Image printed for medical record.

## 2021-02-23 ENCOUNTER — Encounter (HOSPITAL_BASED_OUTPATIENT_CLINIC_OR_DEPARTMENT_OTHER): Payer: Self-pay | Admitting: General Surgery

## 2021-02-25 LAB — SURGICAL PATHOLOGY

## 2021-02-27 ENCOUNTER — Other Ambulatory Visit: Payer: Self-pay

## 2021-02-27 ENCOUNTER — Other Ambulatory Visit: Payer: Self-pay | Admitting: Hematology and Oncology

## 2021-02-27 ENCOUNTER — Encounter: Payer: Self-pay | Admitting: *Deleted

## 2021-02-28 ENCOUNTER — Ambulatory Visit (INDEPENDENT_AMBULATORY_CARE_PROVIDER_SITE_OTHER): Payer: BC Managed Care – PPO | Admitting: Plastic Surgery

## 2021-02-28 ENCOUNTER — Encounter: Payer: Self-pay | Admitting: Plastic Surgery

## 2021-02-28 ENCOUNTER — Other Ambulatory Visit: Payer: Self-pay

## 2021-02-28 DIAGNOSIS — C50919 Malignant neoplasm of unspecified site of unspecified female breast: Secondary | ICD-10-CM

## 2021-02-28 DIAGNOSIS — Z9013 Acquired absence of bilateral breasts and nipples: Secondary | ICD-10-CM

## 2021-02-28 HISTORY — DX: Malignant neoplasm of unspecified site of unspecified female breast: C50.919

## 2021-02-28 MED ORDER — VENLAFAXINE HCL ER 37.5 MG PO CP24: 37.5000 mg | ORAL_CAPSULE | Freq: Every day | ORAL | 0 refills | Status: DC

## 2021-02-28 MED ORDER — KETOROLAC TROMETHAMINE 10 MG PO TABS: 10.0000 mg | ORAL_TABLET | Freq: Three times a day (TID) | ORAL | 0 refills | Status: AC | PRN

## 2021-02-28 MED ORDER — DIAZEPAM 2 MG PO TABS: 2.0000 mg | ORAL_TABLET | Freq: Two times a day (BID) | ORAL | 0 refills | Status: DC | PRN

## 2021-02-28 NOTE — Progress Notes (Signed)
   Subjective:    Patient ID: Ebony Watson, female    DOB: 07/11/1981, 40 y.o.   MRN: [id number]  The patient is a 40 year old female here for follow-up on her breast reconstruction.  She had bilateral mastectomies with expander placement.  She has done very well over the past week.  Her drain output is minimal.  No sign of seroma or hematoma.  Her incisions are healing nicely.  Her drains are in place.  She has not been very happy with the narcotic and would like to try something else.    Review of Systems  Constitutional:  Positive for activity change.  All other systems reviewed and are negative.     Objective:   Physical Exam Vitals and nursing note reviewed.  Constitutional:      Appearance: Normal appearance.  Cardiovascular:     Rate and Rhythm: Normal rate.     Pulses: Normal pulses.  Pulmonary:     Effort: Pulmonary effort is normal.  Neurological:     Mental Status: She is alert. Mental status is at baseline.        Assessment & Plan:     ICD-10-CM   1. Status post partial mastectomy, bilateral  Z90.13       Follow-up in 1 week and we will plan to remove the drains.  I will call in Toradol for her if she notices any increase in drainage she knows to let me know. We placed injectable saline in the Expander using a sterile technique: Right: 50 cc for a total of 200 / 535 cc Left: 50 cc for a total of 200 / 535 cc

## 2021-03-06 ENCOUNTER — Telehealth: Payer: Self-pay | Admitting: Hematology and Oncology

## 2021-03-06 ENCOUNTER — Ambulatory Visit: Payer: BC Managed Care – PPO | Admitting: Hematology and Oncology

## 2021-03-06 NOTE — Telephone Encounter (Signed)
Scheduled appointment per 07/14 sch msg. Patient is aware. 

## 2021-03-06 NOTE — Progress Notes (Deleted)
[facility] FOLLOW UP NOTE  Patient Care Team: Ebony Friendly, MD as PCP - General (Internal Medicine) Ebony Germany, RN as Oncology Nurse Navigator  CHIEF COMPLAINTS/PURPOSE OF CONSULTATION:  Abnormal breast biopsy,  ASSESSMENT & PLAN:   This is a very pleasant 40 year old female patient with BRCA2 mutation who was seen in the high-risk breast cancer clinic, had a breast MRI recently which showed abnormal areas in the right breast and left breast s.p biopsy here to discuss results. 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  Rest of the breast biopsies did not show any evidence of malignancy.  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   She had salpingo-oophorectomy given her BRCA2 mutation.  Since last visit, she also had bilateral mastectomy in June 2022. Pathology showed ductal carcinoma in situ with calcifications measuring 3.8 cm in the left breast, margins not involved, focal atypical lobular hyperplasia, all sentinel lymph nodes from the left axilla negative.  Right breast showed fibrocystic changes with usual ductal hyperplasia and focal sclerosing adenosis.  No evidence of malignancy.  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  She is returning after bilateral mastectomy to discuss additional recommendations. Given bilateral mastectomy, there is no additional role for adjuvant endocrine therapy or radiation.  However since she is a high risk, we will continue monitoring her annually with history and physical exam and any additional questions. We have also discussed about increased risk of pancreatic cancer and the lack of proper screening since she does not have any first-degree relatives with pancreatic cancer, we have discussed symptoms of pancreatic cancer and to contact us with any new or worsening concerns. She should continue  annual follow-up with her dermatologist for melanoma checkup.  She expressed understanding of all recommendations and will return to clinic in about 6 months.  HISTORY OF PRESENTING ILLNESS:  Ebony Watson 40 y.o. 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   INTERIM HISTORY  [name] is here for follow-up after her recent MRI guided breast biopsies.  She is doing quite well.  She has heard about the pathology results.  She says that she wants to proceed with bilateral mastectomies so she does not have to worry about these imaging findings and biopsies in the future.  She denies any new health problems.  She has been doing quite well since the oophorectomy.  She does not have any vasomotor symptoms at this time.  Breast biopsies are healing well. Rest of the pertinent review of systems reviewed and negative.  REVIEW OF SYSTEMS:   Constitutional: Denies fevers, chills or abnormal night sweats Eyes: Denies blurriness of vision, double vision or watery eyes Ears, nose, mouth, throat, and face: Denies mucositis or sore throat Respiratory: Denies cough, dyspnea or wheezes Cardiovascular: Denies palpitation, chest discomfort or lower extremity swelling Gastrointestinal:  Denies nausea, heartburn or change in bowel habits Skin: Denies abnormal skin rashes Lymphatics: Denies new lymphadenopathy or easy bruising Neurological:Denies numbness, tingling or new weaknesses Behavioral/Psych: Mood is stable, no new changes  All other systems were reviewed with the patient and are negative.   MEDICAL HISTORY:  Past Medical History:  Diagnosis Date   Anxiety    Asthma    BRCA2 gene mutation positive in female    Depression    Hyperlipidemia  SURGICAL HISTORY: Past Surgical History:  Procedure Laterality Date   BREAST RECONSTRUCTION WITH PLACEMENT OF TISSUE EXPANDER AND FLEX HD (ACELLULAR HYDRATED DERMIS) Bilateral 02/20/2021    Procedure: BILATERAL IMMEDIATE BREAST RECONSTRUCTION WITH PLACEMENT OF TISSUE EXPANDER AND FLEX HD (ACELLULAR HYDRATED DERMIS);  Surgeon: Wallace Going, DO;  Location: [facility];  Service: Plastics;  Laterality: Bilateral;   MASTECTOMY W/ SENTINEL NODE BIOPSY Left 02/20/2021   Procedure: LEFT MASTECTOMY WITH SENTINEL LYMPH NODE BIOPSY;  Surgeon: Jovita Kussmaul, MD;  Location: [facility];  Service: General;  Laterality: Left;   TOTAL MASTECTOMY Right 02/20/2021   Procedure: RIGHT TOTAL MASTECTOMY;  Surgeon: Jovita Kussmaul, MD;  Location: [facility];  Service: General;  Laterality: Right;   TUBAL LIGATION     WISDOM TOOTH EXTRACTION     XI ROBOTIC ASSISTED OOPHORECTOMY Bilateral 12/10/2020   Procedure: XI ROBOTIC ASSISTED BILATERAL  OOPHORECTOMY,  INTRAUTERINE DEVICE (IUD) INSERTION MIRENA;  Surgeon: Lafonda Mosses, MD;  Location: [facility];  Service: Gynecology;  Laterality: Bilateral;    SOCIAL HISTORY: Social History   Socioeconomic History   Marital status: Single    Spouse name: Not on file   Number of children: Not on file   Years of education: Not on file   Highest education level: Not on file  Occupational History   Occupation: infection control  Tobacco Use   Smoking status: Former    Packs/day: 0.25    Years: 4.00    Pack years: 1.00    Types: Cigarettes    Quit date: 2007    Years since quitting: 15.5   Smokeless tobacco: Never  Vaping Use   Vaping Use: Never used  Substance and Sexual Activity   Alcohol use: Yes    Comment: socially   Drug use: Never   Sexual activity: Yes    Birth control/protection: Surgical  Other Topics Concern   Not on file  Social History Narrative   Not on file   Social Determinants of Health   Financial Resource Strain: Not on file  Food Insecurity: Not on file  Transportation Needs: Not on file  Physical Activity: Not on file  Stress: Not on file  Social Connections: Not on file   Intimate Partner Violence: Not on file    FAMILY HISTORY: Family History  Problem Relation Age of Onset   Drug abuse Mother    Hypertension Mother    Hyperlipidemia Mother    Heart disease Father    Prostate cancer Father    Drug abuse Sister    Breast cancer Maternal Aunt    Ovarian cancer Maternal Aunt    BRCA 1/2 Maternal Aunt    Cervical cancer Sister    Drug abuse Sister     ALLERGIES:  is allergic to hydrocodone-acetaminophen and oxycodone-acetaminophen.  MEDICATIONS:  Current Outpatient Medications  Medication Sig Dispense Refill   ADVAIR DISKUS 250-50 MCG/DOSE AEPB Inhale 1 puff into the lungs 2 (two) times daily.     albuterol (VENTOLIN HFA) 108 (90 Base) MCG/ACT inhaler Inhale 2 puffs into the lungs every 4 (four) hours as needed (Asthma).     Cholecalciferol 125 MCG (5000 UT) capsule Take 5,000 Units by mouth daily.     diazepam (VALIUM) 2 MG tablet Take 1 tablet (2 mg total) by mouth every 12 (twelve) hours as needed for muscle spasms. 20 tablet 0   ketorolac (TORADOL) 10 MG tablet Take 1 tablet (10 mg total) by mouth every 8 (  eight) hours as needed for up to 7 days. 21 tablet 0   loratadine (CLARITIN) 10 MG tablet Take 10 mg by mouth daily.     Multiple Vitamin (MULTI-VITAMIN DAILY PO) Take 1 tablet by mouth daily.     Omega-3 Fatty Acids (FISH OIL PO) Take 1 capsule by mouth daily.     ondansetron (ZOFRAN) 4 MG tablet Take 1 tablet (4 mg total) by mouth every 8 (eight) hours as needed for nausea or vomiting. 20 tablet 0   OVER THE COUNTER MEDICATION Black Cohosh-Take 1 capsule by mouth daily.     Probiotic Product (PROBIOTIC DAILY PO) Take 1 tablet by mouth daily.     venlafaxine XR (EFFEXOR-XR) 37.5 MG 24 hr capsule Take 1 capsule (37.5 mg total) by mouth daily with breakfast. 30 capsule 0   No current facility-administered medications for this visit.      PHYSICAL EXAMINATION: ECOG PERFORMANCE STATUS: 0 - Asymptomatic  There were no vitals filed for this  visit.  There were no vitals filed for this visit.   PE deferred in lieu of counseling  LABORATORY DATA:  I have reviewed the data as listed Lab Results  Component Value Date   WBC 5.7 12/02/2020   HGB 14.6 12/02/2020   HCT 43.6 12/02/2020   MCV 96.0 12/02/2020   PLT 245 12/02/2020     Chemistry      Component Value Date/Time   NA 136 12/02/2020 0933   K 3.7 12/02/2020 0933   CL 104 12/02/2020 0933   CO2 23 12/02/2020 0933   BUN 19 12/02/2020 0933   CREATININE 0.64 12/02/2020 0933      Component Value Date/Time   CALCIUM 9.2 12/02/2020 0933       RADIOGRAPHIC STUDIES: I have personally reviewed the radiological images as listed and agreed with the findings in the report. NM Sentinel Node Inj-No Rpt (Breast)  Result Date: 02/20/2021 Sulfur Colloid was injected by the Nuclear Medicine Technologist for sentinel lymph node localization.     All questions were answered. The patient knows to call the clinic with any problems, questions or concerns. I spent 30 minutes in the care of this patient including reviewing records, pathology results, discussion about DCIS, role of surgery, antiestrogen therapy and radiation, discussed about risks of other cancer given BRCA 2 mutation, Encouraged dermatology annual follow-up, discussed symptoms of pancreatic cancer and the need to promptly alert [place] with any symptoms.    Benay Pike, MD 03/06/2021 9:01 AM

## 2021-03-07 ENCOUNTER — Other Ambulatory Visit: Payer: Self-pay

## 2021-03-07 ENCOUNTER — Encounter: Payer: Self-pay | Admitting: Surgical

## 2021-03-07 ENCOUNTER — Ambulatory Visit (INDEPENDENT_AMBULATORY_CARE_PROVIDER_SITE_OTHER): Payer: BC Managed Care – PPO | Admitting: Surgical

## 2021-03-07 DIAGNOSIS — Z9013 Acquired absence of bilateral breasts and nipples: Secondary | ICD-10-CM

## 2021-03-07 MED ORDER — CYCLOBENZAPRINE HCL 5 MG PO TABS: 5.0000 mg | ORAL_TABLET | Freq: Three times a day (TID) | ORAL | 1 refills | Status: DC | PRN

## 2021-03-07 NOTE — Progress Notes (Signed)
Patient is a 40 year old female here for follow-up on her bilateral breast reconstruction.  She is 2 weeks postop.  She currently has 200 cc in each breast expander.  Patient is overall doing well, reports that the Valium does not help much for her muscle spasms.  She reports that it also makes her feel sweaty.  She would like to try something else if possible.  She is otherwise doing well.  She is not having any infectious symptoms.  Chaperone present on exam On exam bilateral JP drains are in place, minimal serous output noted.  No erythema of either breast noted.  No drainage noted.  No foul odor is noted.  No cellulitic changes.  No subcutaneous fluid collection noted with palpation.   We placed injectable saline in the Expander using a sterile technique: Right: 50 cc for a total of 250 / 535 cc Left: 50 cc for a total of 250 / 535 cc   Recommend following up in 2 weeks for reevaluation.  We can try Flexeril for muscle spasms after expansion. There is no sign of infection, seroma, hematoma.

## 2021-03-12 ENCOUNTER — Inpatient Hospital Stay: Payer: BC Managed Care – PPO | Attending: Hematology and Oncology | Admitting: Hematology and Oncology

## 2021-03-12 ENCOUNTER — Other Ambulatory Visit: Payer: Self-pay

## 2021-03-12 VITALS — BP 116/55 | HR 88 | Temp 98.7°F | Resp 18 | Ht 67.0 in | Wt 192.4 lb

## 2021-03-12 DIAGNOSIS — Z1509 Genetic susceptibility to other malignant neoplasm: Secondary | ICD-10-CM

## 2021-03-12 DIAGNOSIS — D0512 Intraductal carcinoma in situ of left breast: Secondary | ICD-10-CM | POA: Diagnosis not present

## 2021-03-12 DIAGNOSIS — Z87891 Personal history of nicotine dependence: Secondary | ICD-10-CM | POA: Diagnosis not present

## 2021-03-12 DIAGNOSIS — N951 Menopausal and female climacteric states: Secondary | ICD-10-CM | POA: Diagnosis not present

## 2021-03-12 DIAGNOSIS — Z9013 Acquired absence of bilateral breasts and nipples: Secondary | ICD-10-CM | POA: Diagnosis not present

## 2021-03-12 DIAGNOSIS — Z1501 Genetic susceptibility to malignant neoplasm of breast: Secondary | ICD-10-CM | POA: Diagnosis not present

## 2021-03-12 MED ORDER — GABAPENTIN 300 MG PO CAPS: 300.0000 mg | ORAL_CAPSULE | Freq: Three times a day (TID) | ORAL | 3 refills | Status: DC

## 2021-03-12 NOTE — Progress Notes (Signed)
[facility] FOLLOW UP NOTE  Patient Care Team: Alma Friendly, MD as PCP - General (Internal Medicine) Rockwell Germany, RN as Oncology Nurse Navigator  CHIEF COMPLAINTS/PURPOSE OF CONSULTATION:  Abnormal breast biopsy,  ASSESSMENT & PLAN:   This is a very pleasant 40 year old female patient with BRCA2 mutation who was seen in the high-risk breast cancer clinic, had a breast MRI recently which showed abnormal areas in the right breast and left breast s.p biopsy here to discuss results. 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  Rest of the breast biopsies did not show any evidence of malignancy.  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   She had salpingo-oophorectomy given her BRCA2 mutation.  Since last visit, she also had bilateral mastectomy in June 2022. Pathology showed ductal carcinoma in situ with calcifications measuring 3.8 cm in the left breast, margins not involved, focal atypical lobular hyperplasia, all sentinel lymph nodes from the left axilla negative.  Right breast showed fibrocystic changes with usual ductal hyperplasia and focal sclerosing adenosis.  No evidence of malignancy.  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  She is returning after bilateral mastectomy to discuss additional recommendations. Given bilateral mastectomy, there is no additional role for adjuvant endocrine therapy or radiation.  However since she is a high risk, we will continue monitoring her annually with history and physical exam and any additional questions. We have also discussed about increased risk of pancreatic cancer and the lack of proper screening since she does not have any first-degree relatives with pancreatic cancer, we have discussed symptoms of pancreatic cancer and to contact us with any new or worsening concerns. She should continue  annual follow-up with her dermatologist for melanoma checkup.  She expressed understanding of all recommendations and will return to clinic in about 6 months since her hot flashes are not well controlled at this time.  With regards to vasomotor symptoms, she would like to try gabapentin instead of effexor. Sent a prescription.   HISTORY OF PRESENTING ILLNESS:  Ebony Watson 40 y.o. 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   INTERIM HISTORY  [name] is here for follow-up after her bilateral mastectomies. She is doing well except for hot flashes. She was hoping to try something other than effexor, she doesn't want to stay on anti depressants for too long Rest of the pertinent review of systems reviewed and negative.  REVIEW OF SYSTEMS:   Constitutional: Denies fevers, chills or abnormal night sweats Eyes: Denies blurriness of vision, double vision or watery eyes Ears, nose, mouth, throat, and face: Denies mucositis or sore throat Respiratory: Denies cough, dyspnea or wheezes Cardiovascular: Denies palpitation, chest discomfort or lower extremity swelling Gastrointestinal:  Denies nausea, heartburn or change in bowel habits Skin: Denies abnormal skin rashes Lymphatics: Denies new lymphadenopathy or easy bruising Neurological:Denies numbness, tingling or new weaknesses Behavioral/Psych: Mood is stable, no new changes  All other systems were reviewed with the patient and are negative.   MEDICAL HISTORY:  Past Medical History:  Diagnosis Date   Anxiety    Asthma    BRCA2 gene mutation positive in female    Depression    Hyperlipidemia     SURGICAL HISTORY: Past Surgical History:  Procedure Laterality Date   BREAST RECONSTRUCTION WITH PLACEMENT OF TISSUE EXPANDER  AND FLEX HD (ACELLULAR HYDRATED DERMIS) Bilateral 02/20/2021   Procedure: BILATERAL IMMEDIATE BREAST RECONSTRUCTION WITH PLACEMENT OF TISSUE EXPANDER AND  FLEX HD (ACELLULAR HYDRATED DERMIS);  Surgeon: Wallace Going, DO;  Location: [facility];  Service: Plastics;  Laterality: Bilateral;   MASTECTOMY W/ SENTINEL NODE BIOPSY Left 02/20/2021   Procedure: LEFT MASTECTOMY WITH SENTINEL LYMPH NODE BIOPSY;  Surgeon: Jovita Kussmaul, MD;  Location: [facility];  Service: General;  Laterality: Left;   TOTAL MASTECTOMY Right 02/20/2021   Procedure: RIGHT TOTAL MASTECTOMY;  Surgeon: Jovita Kussmaul, MD;  Location: [facility];  Service: General;  Laterality: Right;   TUBAL LIGATION     WISDOM TOOTH EXTRACTION     XI ROBOTIC ASSISTED OOPHORECTOMY Bilateral 12/10/2020   Procedure: XI ROBOTIC ASSISTED BILATERAL  OOPHORECTOMY,  INTRAUTERINE DEVICE (IUD) INSERTION MIRENA;  Surgeon: Lafonda Mosses, MD;  Location: [facility];  Service: Gynecology;  Laterality: Bilateral;    SOCIAL HISTORY: Social History   Socioeconomic History   Marital status: Single    Spouse name: Not on file   Number of children: Not on file   Years of education: Not on file   Highest education level: Not on file  Occupational History   Occupation: infection control  Tobacco Use   Smoking status: Former    Packs/day: 0.25    Years: 4.00    Pack years: 1.00    Types: Cigarettes    Quit date: 2007    Years since quitting: 15.5   Smokeless tobacco: Never  Vaping Use   Vaping Use: Never used  Substance and Sexual Activity   Alcohol use: Yes    Comment: socially   Drug use: Never   Sexual activity: Yes    Birth control/protection: Surgical  Other Topics Concern   Not on file  Social History Narrative   Not on file   Social Determinants of Health   Financial Resource Strain: Not on file  Food Insecurity: Not on file  Transportation Needs: Not on file  Physical Activity: Not on file  Stress: Not on file  Social Connections: Not on file  Intimate Partner Violence: Not on file    FAMILY HISTORY: Family History  Problem  Relation Age of Onset   Drug abuse Mother    Hypertension Mother    Hyperlipidemia Mother    Heart disease Father    Prostate cancer Father    Drug abuse Sister    Breast cancer Maternal Aunt    Ovarian cancer Maternal Aunt    BRCA 1/2 Maternal Aunt    Cervical cancer Sister    Drug abuse Sister     ALLERGIES:  is allergic to hydrocodone-acetaminophen and oxycodone-acetaminophen.  MEDICATIONS:  Current Outpatient Medications  Medication Sig Dispense Refill   ADVAIR DISKUS 250-50 MCG/DOSE AEPB Inhale 1 puff into the lungs 2 (two) times daily.     albuterol (VENTOLIN HFA) 108 (90 Base) MCG/ACT inhaler Inhale 2 puffs into the lungs every 4 (four) hours as needed (Asthma).     Cholecalciferol 125 MCG (5000 UT) capsule Take 5,000 Units by mouth daily.     cyclobenzaprine (FLEXERIL) 5 MG tablet Take 1 tablet (5 mg total) by mouth 3 (three) times daily as needed for muscle spasms. 30 tablet 1   diazepam (VALIUM) 2 MG tablet Take 1 tablet (2 mg total) by mouth every 12 (twelve) hours as needed for muscle spasms. 20 tablet 0   gabapentin (NEURONTIN) 300 MG capsule Take  1 capsule (300 mg total) by mouth 3 (three) times daily. 90 capsule 3   loratadine (CLARITIN) 10 MG tablet Take 10 mg by mouth daily.     Multiple Vitamin (MULTI-VITAMIN DAILY PO) Take 1 tablet by mouth daily.     Omega-3 Fatty Acids (FISH OIL PO) Take 1 capsule by mouth daily.     ondansetron (ZOFRAN) 4 MG tablet Take 1 tablet (4 mg total) by mouth every 8 (eight) hours as needed for nausea or vomiting. 20 tablet 0   OVER THE COUNTER MEDICATION Black Cohosh-Take 1 capsule by mouth daily.     Probiotic Product (PROBIOTIC DAILY PO) Take 1 tablet by mouth daily.     venlafaxine XR (EFFEXOR-XR) 37.5 MG 24 hr capsule Take 1 capsule (37.5 mg total) by mouth daily with breakfast. 30 capsule 0   No current facility-administered medications for this visit.      PHYSICAL EXAMINATION: ECOG PERFORMANCE STATUS: 0 -  Asymptomatic  Vitals:   03/12/21 1421  BP: (!) 116/55  Pulse: 88  Resp: 18  Temp: 98.7 F (37.1 C)  SpO2: 100%    Filed Weights   03/12/21 1421  Weight: 192 lb 6.4 oz (87.3 kg)     PE deferred in lieu of counseling  LABORATORY DATA:  I have reviewed the data as listed Lab Results  Component Value Date   WBC 5.7 12/02/2020   HGB 14.6 12/02/2020   HCT 43.6 12/02/2020   MCV 96.0 12/02/2020   PLT 245 12/02/2020     Chemistry      Component Value Date/Time   NA 136 12/02/2020 0933   K 3.7 12/02/2020 0933   CL 104 12/02/2020 0933   CO2 23 12/02/2020 0933   BUN 19 12/02/2020 0933   CREATININE 0.64 12/02/2020 0933      Component Value Date/Time   CALCIUM 9.2 12/02/2020 0933       RADIOGRAPHIC STUDIES: I have personally reviewed the radiological images as listed and agreed with the findings in the report. NM Sentinel Node Inj-No Rpt (Breast)  Result Date: 02/20/2021 Sulfur Colloid was injected by the Nuclear Medicine Technologist for sentinel lymph node localization.     All questions were answered. The patient knows to call the clinic with any problems, questions or concerns. I spent 30 minutes in the care of this patient including reviewing records, pathology results, discussion about DCIS, role of surgery, antiestrogen therapy and radiation,     Benay Pike, MD 03/12/2021 3:03 PM

## 2021-03-20 ENCOUNTER — Other Ambulatory Visit: Payer: Self-pay | Admitting: Surgical

## 2021-03-20 NOTE — Telephone Encounter (Signed)
Prescription denied.//AB/CMA

## 2021-03-21 ENCOUNTER — Encounter: Payer: Self-pay | Admitting: Rehabilitation

## 2021-03-25 ENCOUNTER — Other Ambulatory Visit: Payer: Self-pay

## 2021-03-25 ENCOUNTER — Ambulatory Visit (INDEPENDENT_AMBULATORY_CARE_PROVIDER_SITE_OTHER): Payer: BC Managed Care – PPO | Admitting: Surgical

## 2021-03-25 DIAGNOSIS — Z9013 Acquired absence of bilateral breasts and nipples: Secondary | ICD-10-CM

## 2021-03-25 DIAGNOSIS — Z1509 Genetic susceptibility to other malignant neoplasm: Secondary | ICD-10-CM

## 2021-03-25 DIAGNOSIS — Z1501 Genetic susceptibility to malignant neoplasm of breast: Secondary | ICD-10-CM

## 2021-03-25 DIAGNOSIS — Z1502 Genetic susceptibility to malignant neoplasm of ovary: Secondary | ICD-10-CM

## 2021-03-25 NOTE — Progress Notes (Signed)
Patient is a 40 year old female here for follow-up on her bilateral breast reconstruction placement of tissue expanders after bilateral mastectomies on 02/20/2021.   She is approximately 1 month postop She reports overall she is doing well.  She tolerated her last fill fine with minimal pain.  She is interested in additional fill today  Chaperone present on exam On exam bilateral breast incisions are intact, no subcutaneous fluid collection noted with palpation.  No erythema.  No wounds noted.   We placed injectable saline in the Expander using a sterile technique: Right: 50 cc for a total of 300 / 535 cc Left: 50 cc for a total of 300 / 535 cc   Recommend following up in 2 weeks for additional fill.  Recommend calling with questions or concerns.  No sign of infection, seroma, hematoma

## 2021-04-04 ENCOUNTER — Encounter: Payer: Self-pay | Admitting: Plastic Surgery

## 2021-04-04 ENCOUNTER — Ambulatory Visit (INDEPENDENT_AMBULATORY_CARE_PROVIDER_SITE_OTHER): Payer: BC Managed Care – PPO | Admitting: Plastic Surgery

## 2021-04-04 ENCOUNTER — Other Ambulatory Visit: Payer: Self-pay

## 2021-04-04 DIAGNOSIS — Z1501 Genetic susceptibility to malignant neoplasm of breast: Secondary | ICD-10-CM

## 2021-04-04 DIAGNOSIS — Z1509 Genetic susceptibility to other malignant neoplasm: Secondary | ICD-10-CM

## 2021-04-04 DIAGNOSIS — Z1502 Genetic susceptibility to malignant neoplasm of ovary: Secondary | ICD-10-CM

## 2021-04-04 DIAGNOSIS — Z9013 Acquired absence of bilateral breasts and nipples: Secondary | ICD-10-CM

## 2021-04-04 MED ORDER — DIAZEPAM 2 MG PO TABS: 2.0000 mg | ORAL_TABLET | Freq: Four times a day (QID) | ORAL | 0 refills | Status: DC | PRN

## 2021-04-04 NOTE — Progress Notes (Signed)
   Subjective:    Patient ID: Ebony Watson, female    DOB: 1981/02/25, 40 y.o.   MRN: [id number]  The patient is a very sweet 40 year old female here for follow-up on her bilateral breast reconstruction.  Overall she is doing really well.  She asked if she could use the Valium instead of the Ultram.  That is fine.  I will send in another prescription for her.  I suggest she save that for after her fills.  No sign of infection.  Incisions are all healing nicely.     Review of Systems  Constitutional: Negative.   Eyes: Negative.   Respiratory: Negative.    Cardiovascular: Negative.   Gastrointestinal: Negative.   Endocrine: Negative.   Genitourinary: Negative.       Objective:   Physical Exam Vitals and nursing note reviewed.  Constitutional:      Appearance: Normal appearance.  HENT:     Head: Normocephalic and atraumatic.  Cardiovascular:     Rate and Rhythm: Normal rate.     Pulses: Normal pulses.  Pulmonary:     Effort: Pulmonary effort is normal.  Skin:    Capillary Refill: Capillary refill takes less than 2 seconds.  Neurological:     General: No focal deficit present.     Mental Status: She is alert and oriented to person, place, and time.       Assessment & Plan:     ICD-10-CM   1. Status post partial mastectomy, bilateral  Z90.13     2. BRCA2 gene mutation positive in female  Z15.01    Z15.02    Z15.09       We placed injectable saline in the Expander using a sterile technique: Right: 70 cc for a total of 370 / 535 cc Left: 70 cc for a total of 370 / 535 cc  Follow-up in 1 to 2 weeks.

## 2021-04-09 ENCOUNTER — Other Ambulatory Visit: Payer: Self-pay | Admitting: Surgical

## 2021-04-09 ENCOUNTER — Other Ambulatory Visit: Payer: Self-pay | Admitting: Physician Assistant

## 2021-04-09 ENCOUNTER — Other Ambulatory Visit: Payer: Self-pay | Admitting: Hematology and Oncology

## 2021-04-09 NOTE — Telephone Encounter (Addendum)
Evidently there was a prescription request from the pharmacy for refill for her cyclobenzaprine.  I noticed that she recently filled a prescription for benzodiazepines so I called her to clarify.  She states that she has been having some difficulty sleeping in context of mild discomfort, but that she does not require any further medications.    Called the pharmacy to cancel the refill ordered for her Flexeril.

## 2021-04-10 ENCOUNTER — Encounter: Payer: Self-pay | Admitting: Plastic Surgery

## 2021-04-15 NOTE — Progress Notes (Addendum)
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  She was seen most recently on 04/04/2021 by Dr. Marla Roe.  Plan was for continued pain control with Valium as needed in addition to her Tylenol and NSAIDs.  Her physical exam was reassuring and without evidence concerning for infection, seroma, or hematoma.  She had an additional 70 cc expander fill, now 370 out of 535 cc bilaterally.  She then called the office later that week inquiring about abdominoplasty, but was discouraged at this time while in the process of having breast reconstruction.  On my examination, patient is doing well.  She denies any changes in her interim health.  No fevers, chills, worsening breast pain, swelling, tightness, redness, or any other symptoms.  She is only taking ibuprofen as needed for pain control at this time.  She states that she would like to proceed with additional fill.  Her physical exam is reassuring.  No dehiscence of wounds.  No erythema, induration, or warmth otherwise concerning for infection.  No asymmetric swelling or evidence of fluid collection suggestive of seroma or hematoma.  We placed injectable saline in the Expander using a sterile technique: Right: 50 cc for a total of 420 / 535 cc Left: 50 cc for a total of 420 / 535 cc  Procedure was tolerated well and without any complication.  She would like to have at least 1 more fill.  Plan to have her return in 2 weeks for additional fill.  She states that she was a D cup prior to pregnancy and cancer and would like to have similar size.

## 2021-04-18 ENCOUNTER — Ambulatory Visit (INDEPENDENT_AMBULATORY_CARE_PROVIDER_SITE_OTHER): Payer: BC Managed Care – PPO | Admitting: Physician Assistant

## 2021-04-18 ENCOUNTER — Other Ambulatory Visit: Payer: Self-pay

## 2021-04-18 DIAGNOSIS — Z9013 Acquired absence of bilateral breasts and nipples: Secondary | ICD-10-CM

## 2021-04-30 NOTE — Progress Notes (Signed)
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  Patient was seen most recently in clinic on 04/18/2021.  At that time, physical exam was reassuring and without any evidence of dehiscence, seroma, hematoma, or infection.  50 cc injectable saline was placed bilaterally, totaling 420 / 535 cc bilaterally.  She expressed that she would like to have at least 1 more fill.  She reports that she was a D cup prior to her pregnancy and cancer and would ideally be similar in size.  Patient denies any interim changes in her health.  She denies any redness, swelling, fevers, chills, or other changes related to her expander placement.  She is prepared for an additional fill.  We placed injectable saline in the Expander using a sterile technique: Right: 70 cc for a total of 490 / 535 cc Left: 70 cc for a total of 490 / 535 cc  Patient tolerated procedure without difficulty, but does report that it feels tighter after injection.  Plan is for [place] to call her about scheduling surgery for implant placement, preceded by preoperative exam.  She will call should she develop any questions or concerns.

## 2021-05-02 ENCOUNTER — Ambulatory Visit (INDEPENDENT_AMBULATORY_CARE_PROVIDER_SITE_OTHER): Payer: BC Managed Care – PPO | Admitting: Physician Assistant

## 2021-05-02 ENCOUNTER — Other Ambulatory Visit: Payer: Self-pay

## 2021-05-02 DIAGNOSIS — Z9013 Acquired absence of bilateral breasts and nipples: Secondary | ICD-10-CM

## 2021-05-07 ENCOUNTER — Encounter: Payer: Self-pay | Admitting: Hematology and Oncology

## 2021-05-14 NOTE — Progress Notes (Cosign Needed)
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  Patient was seen most recently here in clinic on 04/01/2021.  At that time, 70 cc was injected bilaterally for a total of 490 / 535 cc.  She would like to do at least 1 additional fill prior to scheduling preoperative appointment.  Today, she tells me that she would ideally have her implant exchange performed by the end of the year.  She denies any recent changes in her interim health.  No recent fevers, chest pain, shortness of breath, leg swelling, or other symptoms.  She hopes that we can get her back to her preoperative D cup, but understands that we cannot guarantee cup size.  She states that her skin did feel tight after her last expander injection, but that her symptoms soon resolved.  No concerning physical exam findings today.  We placed injectable saline in the Expander using a sterile technique: Right: 70 cc for a total of 560/535 cc Left: 70 cc for a total of 560/535 cc

## 2021-05-16 ENCOUNTER — Other Ambulatory Visit: Payer: Self-pay

## 2021-05-16 ENCOUNTER — Ambulatory Visit (INDEPENDENT_AMBULATORY_CARE_PROVIDER_SITE_OTHER): Payer: BC Managed Care – PPO | Admitting: Physician Assistant

## 2021-05-16 DIAGNOSIS — Z9889 Other specified postprocedural states: Secondary | ICD-10-CM

## 2021-05-27 ENCOUNTER — Ambulatory Visit: Payer: BC Managed Care – PPO | Admitting: Hematology and Oncology

## 2021-05-27 ENCOUNTER — Other Ambulatory Visit: Payer: Self-pay | Admitting: Gynecologic Oncology

## 2021-05-27 ENCOUNTER — Encounter: Payer: Self-pay | Admitting: Gynecologic Oncology

## 2021-05-27 DIAGNOSIS — N952 Postmenopausal atrophic vaginitis: Secondary | ICD-10-CM | POA: Insufficient documentation

## 2021-05-27 MED ORDER — ESTRADIOL 0.1 MG/GM VA CREA: 1.0000 | TOPICAL_CREAM | VAGINAL | 3 refills | Status: DC

## 2021-05-27 NOTE — Progress Notes (Signed)
See patient mychart message. Per Dr. 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

## 2021-06-03 NOTE — Progress Notes (Signed)
Patient ID: Ebony Watson, female    DOB: Jan 20, 1981, 40 y.o.   MRN: [id number]  Chief Complaint  Patient presents with   Pre-op Exam       ICD-10-CM   1. S/P breast reconstruction  Z98.890        History of Present Illness: Ebony Watson is a 40 y.o.  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  She presents for preoperative evaluation for upcoming procedure, implant exchange, scheduled for 06/19/2021 with Dr. Marla Roe.  The patient has not had problems with anesthesia.  Patient is excited about second phase of reconstruction.  She does have Steri-Strips sensitivity and would like to avoid this if possible.  She has a history of asthma, well controlled with inhalers.  She states that it does have a tendency to flare this time of year.  Denies any SOB or current symptoms.  Non-smoker.  No diabetes.  No other significant past medical history.  No personal or family history of clots or clotting disorder.  She recently had biopsies over her anterior chest wall, but states that they are healing well.  No symptoms concerning for infection.  She emphasized that she wants to be a D or double D.  Summary of Previous Visit: Patient was seen most recently for postoperative evaluation and expander fill on 05/16/2021.  At that time, she concluded with 560/535 cc bilaterally.  She was doing well and her exam was reassuring.  She expressed interest in proceeding with second phase of reconstruction.  She hopes to be back at her preoperative D cup after implant exchange.  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  [place] Significant for: Left-sided breast cancer, anxiety, well-controlled asthma.   Past Medical History: Allergies: Allergies  Allergen Reactions   Hydrocodone-Acetaminophen Shortness Of Breath   Oxycodone-Acetaminophen Shortness Of Breath    Current Medications:  Current  Outpatient Medications:    cephALEXin (KEFLEX) 500 MG capsule, Take 1 capsule (500 mg total) by mouth 4 (four) times daily for 3 days., Disp: 12 capsule, Rfl: 0   diazepam (VALIUM) 2 MG tablet, Take 1 tablet (2 mg total) by mouth every 12 (twelve) hours as needed for muscle spasms., Disp: 20 tablet, Rfl: 0   ondansetron (ZOFRAN ODT) 4 MG disintegrating tablet, Take 1 tablet (4 mg total) by mouth every 8 (eight) hours as needed for nausea or vomiting., Disp: 20 tablet, Rfl: 0   traMADol (ULTRAM) 50 MG tablet, Take 1 tablet (50 mg total) by mouth every 8 (eight) hours as needed for up to 7 days for severe pain., Disp: 20 tablet, Rfl: 0   ADVAIR DISKUS 250-50 MCG/DOSE AEPB, Inhale 1 puff into the lungs 2 (two) times daily., Disp: , Rfl:    albuterol (VENTOLIN HFA) 108 (90 Base) MCG/ACT inhaler, Inhale 2 puffs into the lungs every 4 (four) hours as needed (Asthma)., Disp: , Rfl:    Cholecalciferol 125 MCG (5000 UT) capsule, Take 5,000 Units by mouth daily., Disp: , Rfl:    cyclobenzaprine (FLEXERIL) 5 MG tablet, TAKE ONE TABLET (5 MG TOTAL) BY MOUTH THREE (3) TIMES DAILY AS NEEDED FOR MUSCLE SPASMS, Disp: 30 tablet, Rfl: 1   estradiol (ESTRACE VAGINAL) 0.1 MG/GM vaginal cream, Place 1 Applicatorful vaginally 3 (three) times a week., Disp: 42.5 g, Rfl: 3   gabapentin (NEURONTIN) 300 MG capsule, Take 1 capsule (300 mg total) by mouth 3 (three) times daily., Disp: 90  capsule, Rfl: 3   loratadine (CLARITIN) 10 MG tablet, Take 10 mg by mouth daily., Disp: , Rfl:    Multiple Vitamin (MULTI-VITAMIN DAILY PO), Take 1 tablet by mouth daily., Disp: , Rfl:    Omega-3 Fatty Acids (FISH OIL PO), Take 1 capsule by mouth daily., Disp: , Rfl:    ondansetron (ZOFRAN) 4 MG tablet, Take 1 tablet (4 mg total) by mouth every 8 (eight) hours as needed for nausea or vomiting., Disp: 20 tablet, Rfl: 0   OVER THE COUNTER MEDICATION, Black Cohosh-Take 1 capsule by mouth daily., Disp: , Rfl:    Probiotic Product (PROBIOTIC DAILY  PO), Take 1 tablet by mouth daily., Disp: , Rfl:    venlafaxine XR (EFFEXOR-XR) 37.5 MG 24 hr capsule, TAKE 1 CAPSULE BY MOUTH ONCE DAILY WITH BREAKFAST, Disp: 30 capsule, Rfl: 0  Past Medical Problems: Past Medical History:  Diagnosis Date   Anxiety    Asthma    BRCA2 gene mutation positive in female    Depression    Hyperlipidemia     Past Surgical History: Past Surgical History:  Procedure Laterality Date   BREAST RECONSTRUCTION WITH PLACEMENT OF TISSUE EXPANDER AND FLEX HD (ACELLULAR HYDRATED DERMIS) Bilateral 02/20/2021   Procedure: BILATERAL IMMEDIATE BREAST RECONSTRUCTION WITH PLACEMENT OF TISSUE EXPANDER AND FLEX HD (ACELLULAR HYDRATED DERMIS);  Surgeon: Wallace Going, DO;  Location: [place];  Service: Plastics;  Laterality: Bilateral;   MASTECTOMY W/ SENTINEL NODE BIOPSY Left 02/20/2021   Procedure: LEFT MASTECTOMY WITH SENTINEL LYMPH NODE BIOPSY;  Surgeon: Jovita Kussmaul, MD;  Location: [place];  Service: General;  Laterality: Left;   TOTAL MASTECTOMY Right 02/20/2021   Procedure: RIGHT TOTAL MASTECTOMY;  Surgeon: Jovita Kussmaul, MD;  Location: [place];  Service: General;  Laterality: Right;   TUBAL LIGATION     WISDOM TOOTH EXTRACTION     XI ROBOTIC ASSISTED OOPHORECTOMY Bilateral 12/10/2020   Procedure: XI ROBOTIC ASSISTED BILATERAL  OOPHORECTOMY,  INTRAUTERINE DEVICE (IUD) INSERTION MIRENA;  Surgeon: Lafonda Mosses, MD;  Location: [facility];  Service: Gynecology;  Laterality: Bilateral;    Social History: Social History   Socioeconomic History   Marital status: Single    Spouse name: Not on file   Number of children: Not on file   Years of education: Not on file   Highest education level: Not on file  Occupational History   Occupation: infection control  Tobacco Use   Smoking status: Former    Packs/day: 0.25    Years: 4.00    Pack years: 1.00    Types: Cigarettes    Quit date: 2007    Years since  quitting: 15.7   Smokeless tobacco: Never  Vaping Use   Vaping Use: Never used  Substance and Sexual Activity   Alcohol use: Yes    Comment: socially   Drug use: Never   Sexual activity: Yes    Birth control/protection: Surgical  Other Topics Concern   Not on file  Social History Narrative   Not on file   Social Determinants of Health   Financial Resource Strain: Not on file  Food Insecurity: Not on file  Transportation Needs: Not on file  Physical Activity: Not on file  Stress: Not on file  Social Connections: Not on file  Intimate Partner Violence: Not on file    Family History: Family History  Problem Relation Age of Onset   Drug abuse Mother    Hypertension Mother  Hyperlipidemia Mother    Heart disease Father    Prostate cancer Father    Drug abuse Sister    Breast cancer Maternal Aunt    Ovarian cancer Maternal Aunt    BRCA 1/2 Maternal Aunt    Cervical cancer Sister    Drug abuse Sister     Review of Systems: ROS No recent illness or infection.  Physical Exam: Vital Signs BP (!) 102/59 (BP Location: Right Arm, Patient Position: Sitting, Cuff Size: Large)   Pulse 79   Ht _0  (1.702 m)   Wt 194 lb 6.4 oz (88.2 kg)   LMP 11/29/2020   SpO2 98%   BMI 30.45 kg/m   Physical Exam  Constitutional:      General: Not in acute distress.    Appearance: Normal appearance. Not ill-appearing.  HENT:     Head: Normocephalic and atraumatic.  Eyes:     Pupils: Pupils are equal, round Neck:     Musculoskeletal: Normal range of motion.  Cardiovascular:     Rate and Rhythm: Normal rate    Pulses: Normal pulses.  Pulmonary:     Effort: Pulmonary effort is normal. No respiratory distress.  No overt wheezing. Abdominal:     General: Abdomen is flat. There is no distension.  Musculoskeletal: Normal range of motion.  No lower extremity swelling or edema.  Peripheral pulses intact.  No varicosities. Skin:    General: Skin is warm and dry.     Findings: No  erythema or rash.  Neurological:     General: No focal deficit present.     Mental Status: Alert and oriented to person, place, and time. Mental status is at baseline.     Motor: No weakness.  Psychiatric:        Mood and Affect: Mood normal.        Behavior: Behavior normal.    Assessment/Plan: The patient is scheduled for implant exchange 06/19/2021 with Dr. Marla Roe.  Risks, benefits, and alternatives of procedure discussed, questions answered and consent obtained.    Smoking Status: Non-smoker. Last Mammogram: Mastectomy.  Caprini Score: 5; Risk Factors include: History of malignancy, BMI greater than 25, and length of planned surgery. Recommendation for mechanical prophylaxis. Encourage early ambulation.   Pictures obtained: Today.  Post-op Rx sent to pharmacy: Ultram, Zofran, Keflex, and Valium.  She reports allergy to oxycodone, but states that she can take Ultram without difficulty.  Patient was provided with the General Surgical Risk consent document and Pain Medication Agreement prior to their appointment.  They had adequate time to read through the risk consent documents and Pain Medication Agreement. We also discussed them in person together during this preop appointment. All of their questions were answered to their satisfaction.  Recommended calling if they have any further questions.  Risk consent form and Pain Medication Agreement to be scanned into patient's chart.  The risks that can be encountered with and after placement of a breast implant placement were discussed and include the following but not limited to these: bleeding, infection, delayed healing, anesthesia risks, skin sensation changes, injury to structures including nerves, blood vessels, and muscles which may be temporary or permanent, allergies to tape, suture materials and glues, blood products, topical preparations or injected agents, skin contour irregularities, skin discoloration and swelling, deep vein  thrombosis, cardiac and pulmonary complications, pain, which may persist, fluid accumulation, wrinkling of the skin over the implant, changes in nipple or breast sensation, implant leakage or rupture, faulty position of the  implant, persistent pain, formation of tight scar tissue around the implant (capsular contracture), possible need for revisional surgery or staged procedures.    Electronically signed by: Krista Blue, PA-C 06/06/2021 12:57 PM

## 2021-06-03 NOTE — H&P (View-Only) (Signed)
Patient ID: Ebony Watson, female    DOB: Jan 20, 1981, 40 y.o.   MRN: [id number]  Chief Complaint  Patient presents with   Pre-op Exam       ICD-10-CM   1. S/P breast reconstruction  Z98.890        History of Present Illness: Ebony Watson is a 40 y.o.  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  She presents for preoperative evaluation for upcoming procedure, implant exchange, scheduled for 06/19/2021 with Dr. Marla Roe.  The patient has not had problems with anesthesia.  Patient is excited about second phase of reconstruction.  She does have Steri-Strips sensitivity and would like to avoid this if possible.  She has a history of asthma, well controlled with inhalers.  She states that it does have a tendency to flare this time of year.  Denies any SOB or current symptoms.  Non-smoker.  No diabetes.  No other significant past medical history.  No personal or family history of clots or clotting disorder.  She recently had biopsies over her anterior chest wall, but states that they are healing well.  No symptoms concerning for infection.  She emphasized that she wants to be a D or double D.  Summary of Previous Visit: Patient was seen most recently for postoperative evaluation and expander fill on 05/16/2021.  At that time, she concluded with 560/535 cc bilaterally.  She was doing well and her exam was reassuring.  She expressed interest in proceeding with second phase of reconstruction.  She hopes to be back at her preoperative D cup after implant exchange.  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  [place] Significant for: Left-sided breast cancer, anxiety, well-controlled asthma.   Past Medical History: Allergies: Allergies  Allergen Reactions   Hydrocodone-Acetaminophen Shortness Of Breath   Oxycodone-Acetaminophen Shortness Of Breath    Current Medications:  Current  Outpatient Medications:    cephALEXin (KEFLEX) 500 MG capsule, Take 1 capsule (500 mg total) by mouth 4 (four) times daily for 3 days., Disp: 12 capsule, Rfl: 0   diazepam (VALIUM) 2 MG tablet, Take 1 tablet (2 mg total) by mouth every 12 (twelve) hours as needed for muscle spasms., Disp: 20 tablet, Rfl: 0   ondansetron (ZOFRAN ODT) 4 MG disintegrating tablet, Take 1 tablet (4 mg total) by mouth every 8 (eight) hours as needed for nausea or vomiting., Disp: 20 tablet, Rfl: 0   traMADol (ULTRAM) 50 MG tablet, Take 1 tablet (50 mg total) by mouth every 8 (eight) hours as needed for up to 7 days for severe pain., Disp: 20 tablet, Rfl: 0   ADVAIR DISKUS 250-50 MCG/DOSE AEPB, Inhale 1 puff into the lungs 2 (two) times daily., Disp: , Rfl:    albuterol (VENTOLIN HFA) 108 (90 Base) MCG/ACT inhaler, Inhale 2 puffs into the lungs every 4 (four) hours as needed (Asthma)., Disp: , Rfl:    Cholecalciferol 125 MCG (5000 UT) capsule, Take 5,000 Units by mouth daily., Disp: , Rfl:    cyclobenzaprine (FLEXERIL) 5 MG tablet, TAKE ONE TABLET (5 MG TOTAL) BY MOUTH THREE (3) TIMES DAILY AS NEEDED FOR MUSCLE SPASMS, Disp: 30 tablet, Rfl: 1   estradiol (ESTRACE VAGINAL) 0.1 MG/GM vaginal cream, Place 1 Applicatorful vaginally 3 (three) times a week., Disp: 42.5 g, Rfl: 3   gabapentin (NEURONTIN) 300 MG capsule, Take 1 capsule (300 mg total) by mouth 3 (three) times daily., Disp: 90  capsule, Rfl: 3   loratadine (CLARITIN) 10 MG tablet, Take 10 mg by mouth daily., Disp: , Rfl:    Multiple Vitamin (MULTI-VITAMIN DAILY PO), Take 1 tablet by mouth daily., Disp: , Rfl:    Omega-3 Fatty Acids (FISH OIL PO), Take 1 capsule by mouth daily., Disp: , Rfl:    ondansetron (ZOFRAN) 4 MG tablet, Take 1 tablet (4 mg total) by mouth every 8 (eight) hours as needed for nausea or vomiting., Disp: 20 tablet, Rfl: 0   OVER THE COUNTER MEDICATION, Black Cohosh-Take 1 capsule by mouth daily., Disp: , Rfl:    Probiotic Product (PROBIOTIC DAILY  PO), Take 1 tablet by mouth daily., Disp: , Rfl:    venlafaxine XR (EFFEXOR-XR) 37.5 MG 24 hr capsule, TAKE 1 CAPSULE BY MOUTH ONCE DAILY WITH BREAKFAST, Disp: 30 capsule, Rfl: 0  Past Medical Problems: Past Medical History:  Diagnosis Date   Anxiety    Asthma    BRCA2 gene mutation positive in female    Depression    Hyperlipidemia     Past Surgical History: Past Surgical History:  Procedure Laterality Date   BREAST RECONSTRUCTION WITH PLACEMENT OF TISSUE EXPANDER AND FLEX HD (ACELLULAR HYDRATED DERMIS) Bilateral 02/20/2021   Procedure: BILATERAL IMMEDIATE BREAST RECONSTRUCTION WITH PLACEMENT OF TISSUE EXPANDER AND FLEX HD (ACELLULAR HYDRATED DERMIS);  Surgeon: Wallace Going, DO;  Location: [place];  Service: Plastics;  Laterality: Bilateral;   MASTECTOMY W/ SENTINEL NODE BIOPSY Left 02/20/2021   Procedure: LEFT MASTECTOMY WITH SENTINEL LYMPH NODE BIOPSY;  Surgeon: Jovita Kussmaul, MD;  Location: [place];  Service: General;  Laterality: Left;   TOTAL MASTECTOMY Right 02/20/2021   Procedure: RIGHT TOTAL MASTECTOMY;  Surgeon: Jovita Kussmaul, MD;  Location: [place];  Service: General;  Laterality: Right;   TUBAL LIGATION     WISDOM TOOTH EXTRACTION     XI ROBOTIC ASSISTED OOPHORECTOMY Bilateral 12/10/2020   Procedure: XI ROBOTIC ASSISTED BILATERAL  OOPHORECTOMY,  INTRAUTERINE DEVICE (IUD) INSERTION MIRENA;  Surgeon: Lafonda Mosses, MD;  Location: [facility];  Service: Gynecology;  Laterality: Bilateral;    Social History: Social History   Socioeconomic History   Marital status: Single    Spouse name: Not on file   Number of children: Not on file   Years of education: Not on file   Highest education level: Not on file  Occupational History   Occupation: infection control  Tobacco Use   Smoking status: Former    Packs/day: 0.25    Years: 4.00    Pack years: 1.00    Types: Cigarettes    Quit date: 2007    Years since  quitting: 15.7   Smokeless tobacco: Never  Vaping Use   Vaping Use: Never used  Substance and Sexual Activity   Alcohol use: Yes    Comment: socially   Drug use: Never   Sexual activity: Yes    Birth control/protection: Surgical  Other Topics Concern   Not on file  Social History Narrative   Not on file   Social Determinants of Health   Financial Resource Strain: Not on file  Food Insecurity: Not on file  Transportation Needs: Not on file  Physical Activity: Not on file  Stress: Not on file  Social Connections: Not on file  Intimate Partner Violence: Not on file    Family History: Family History  Problem Relation Age of Onset   Drug abuse Mother    Hypertension Mother  Hyperlipidemia Mother    Heart disease Father    Prostate cancer Father    Drug abuse Sister    Breast cancer Maternal Aunt    Ovarian cancer Maternal Aunt    BRCA 1/2 Maternal Aunt    Cervical cancer Sister    Drug abuse Sister     Review of Systems: ROS No recent illness or infection.  Physical Exam: Vital Signs BP (!) 102/59 (BP Location: Right Arm, Patient Position: Sitting, Cuff Size: Large)   Pulse 79   Ht 5' 7" (1.702 m)   Wt 194 lb 6.4 oz (88.2 kg)   LMP 11/29/2020   SpO2 98%   BMI 30.45 kg/m   Physical Exam  Constitutional:      General: Not in acute distress.    Appearance: Normal appearance. Not ill-appearing.  HENT:     Head: Normocephalic and atraumatic.  Eyes:     Pupils: Pupils are equal, round Neck:     Musculoskeletal: Normal range of motion.  Cardiovascular:     Rate and Rhythm: Normal rate    Pulses: Normal pulses.  Pulmonary:     Effort: Pulmonary effort is normal. No respiratory distress.  No overt wheezing. Abdominal:     General: Abdomen is flat. There is no distension.  Musculoskeletal: Normal range of motion.  No lower extremity swelling or edema.  Peripheral pulses intact.  No varicosities. Skin:    General: Skin is warm and dry.     Findings: No  erythema or rash.  Neurological:     General: No focal deficit present.     Mental Status: Alert and oriented to person, place, and time. Mental status is at baseline.     Motor: No weakness.  Psychiatric:        Mood and Affect: Mood normal.        Behavior: Behavior normal.    Assessment/Plan: The patient is scheduled for implant exchange 06/19/2021 with Dr. Dillingham.  Risks, benefits, and alternatives of procedure discussed, questions answered and consent obtained.    Smoking Status: Non-smoker. Last Mammogram: Mastectomy.  Caprini Score: 5; Risk Factors include: History of malignancy, BMI greater than 25, and length of planned surgery. Recommendation for mechanical prophylaxis. Encourage early ambulation.   Pictures obtained: Today.  Post-op Rx sent to pharmacy: Ultram, Zofran, Keflex, and Valium.  She reports allergy to oxycodone, but states that she can take Ultram without difficulty.  Patient was provided with the General Surgical Risk consent document and Pain Medication Agreement prior to their appointment.  They had adequate time to read through the risk consent documents and Pain Medication Agreement. We also discussed them in person together during this preop appointment. All of their questions were answered to their satisfaction.  Recommended calling if they have any further questions.  Risk consent form and Pain Medication Agreement to be scanned into patient's chart.  The risks that can be encountered with and after placement of a breast implant placement were discussed and include the following but not limited to these: bleeding, infection, delayed healing, anesthesia risks, skin sensation changes, injury to structures including nerves, blood vessels, and muscles which may be temporary or permanent, allergies to tape, suture materials and glues, blood products, topical preparations or injected agents, skin contour irregularities, skin discoloration and swelling, deep vein  thrombosis, cardiac and pulmonary complications, pain, which may persist, fluid accumulation, wrinkling of the skin over the implant, changes in nipple or breast sensation, implant leakage or rupture, faulty position of the   implant, persistent pain, formation of tight scar tissue around the implant (capsular contracture), possible need for revisional surgery or staged procedures.    Electronically signed by: Krista Blue, PA-C 06/06/2021 12:57 PM

## 2021-06-06 ENCOUNTER — Ambulatory Visit (INDEPENDENT_AMBULATORY_CARE_PROVIDER_SITE_OTHER): Payer: BC Managed Care – PPO | Admitting: Physician Assistant

## 2021-06-06 ENCOUNTER — Encounter: Payer: Self-pay | Admitting: Physician Assistant

## 2021-06-06 ENCOUNTER — Other Ambulatory Visit: Payer: Self-pay

## 2021-06-06 VITALS — BP 102/59 | HR 79 | Ht 67.0 in | Wt 194.4 lb

## 2021-06-06 DIAGNOSIS — Z9889 Other specified postprocedural states: Secondary | ICD-10-CM

## 2021-06-06 MED ORDER — DIAZEPAM 2 MG PO TABS: 2.0000 mg | ORAL_TABLET | Freq: Two times a day (BID) | ORAL | 0 refills | Status: DC | PRN

## 2021-06-06 MED ORDER — CEPHALEXIN 500 MG PO CAPS: 500.0000 mg | ORAL_CAPSULE | Freq: Four times a day (QID) | ORAL | 0 refills | Status: AC

## 2021-06-06 MED ORDER — ONDANSETRON 4 MG PO TBDP: 4.0000 mg | ORAL_TABLET | Freq: Three times a day (TID) | ORAL | 0 refills | Status: DC | PRN

## 2021-06-06 MED ORDER — TRAMADOL HCL 50 MG PO TABS: 50.0000 mg | ORAL_TABLET | Freq: Three times a day (TID) | ORAL | 0 refills | Status: AC | PRN

## 2021-06-11 ENCOUNTER — Other Ambulatory Visit: Payer: Self-pay

## 2021-06-11 ENCOUNTER — Encounter (HOSPITAL_BASED_OUTPATIENT_CLINIC_OR_DEPARTMENT_OTHER): Payer: Self-pay | Admitting: Plastic Surgery

## 2021-06-16 NOTE — Progress Notes (Signed)
Surgical soap given with instructions, pt verbalized understanding.  

## 2021-06-19 ENCOUNTER — Ambulatory Visit (HOSPITAL_BASED_OUTPATIENT_CLINIC_OR_DEPARTMENT_OTHER): Payer: BC Managed Care – PPO | Admitting: Anesthesiology

## 2021-06-19 ENCOUNTER — Encounter (HOSPITAL_BASED_OUTPATIENT_CLINIC_OR_DEPARTMENT_OTHER)
Admission: RE | Disposition: A | Discharge status: Home / Self Care | Payer: Self-pay | Source: Home / Self Care | Attending: Plastic Surgery

## 2021-06-19 ENCOUNTER — Ambulatory Visit (HOSPITAL_BASED_OUTPATIENT_CLINIC_OR_DEPARTMENT_OTHER)
Admission: RE | Admit: 2021-06-19 | Discharge: 2021-06-19 | Disposition: A | Discharge status: Home / Self Care | Payer: BC Managed Care – PPO | Attending: Plastic Surgery | Admitting: Plastic Surgery

## 2021-06-19 ENCOUNTER — Other Ambulatory Visit: Payer: Self-pay

## 2021-06-19 ENCOUNTER — Encounter (HOSPITAL_BASED_OUTPATIENT_CLINIC_OR_DEPARTMENT_OTHER): Payer: Self-pay | Admitting: Plastic Surgery

## 2021-06-19 DIAGNOSIS — Z87891 Personal history of nicotine dependence: Secondary | ICD-10-CM | POA: Insufficient documentation

## 2021-06-19 DIAGNOSIS — Z853 Personal history of malignant neoplasm of breast: Secondary | ICD-10-CM | POA: Insufficient documentation

## 2021-06-19 DIAGNOSIS — Z9013 Acquired absence of bilateral breasts and nipples: Secondary | ICD-10-CM | POA: Insufficient documentation

## 2021-06-19 HISTORY — PX: LIPOSUCTION: SHX10

## 2021-06-19 HISTORY — PX: REMOVAL OF BILATERAL TISSUE EXPANDERS WITH PLACEMENT OF BILATERAL BREAST IMPLANTS: SHX6431

## 2021-06-19 SURGERY — REMOVAL, TISSUE EXPANDER, BREAST, BILATERAL, WITH BILATERAL IMPLANT IMPLANT INSERTION
Anesthesia: General | Site: Breast | Laterality: Bilateral

## 2021-06-19 MED ORDER — MIDAZOLAM HCL 2 MG/2ML IJ SOLN
INTRAMUSCULAR | Status: AC
  Filled 2021-06-19: qty 2

## 2021-06-19 MED ORDER — HYDROMORPHONE HCL 1 MG/ML IJ SOLN
INTRAMUSCULAR | Status: AC
  Filled 2021-06-19: qty 0.5

## 2021-06-19 MED ORDER — FENTANYL CITRATE (PF) 100 MCG/2ML IJ SOLN
INTRAMUSCULAR | Status: AC
  Filled 2021-06-19: qty 2

## 2021-06-19 MED ORDER — SODIUM CHLORIDE 0.9 % IV SOLN
INTRAVENOUS | Status: DC | PRN
  Administered 2021-06-19: 500 mL

## 2021-06-19 MED ORDER — LACTATED RINGERS IV SOLN: INTRAVENOUS | Status: DC

## 2021-06-19 MED ORDER — OXYMETAZOLINE HCL 0.05 % NA SOLN
NASAL | Status: AC
  Filled 2021-06-19: qty 30

## 2021-06-19 MED ORDER — SODIUM CHLORIDE 0.9 % IV SOLN: 250.0000 mL | INTRAVENOUS | Status: DC | PRN

## 2021-06-19 MED ORDER — FENTANYL CITRATE (PF) 100 MCG/2ML IJ SOLN: 25.0000 ug | INTRAMUSCULAR | Status: DC | PRN

## 2021-06-19 MED ORDER — MEPERIDINE HCL 25 MG/ML IJ SOLN: 6.2500 mg | INTRAMUSCULAR | Status: DC | PRN

## 2021-06-19 MED ORDER — PROPOFOL 10 MG/ML IV BOLUS
INTRAVENOUS | Status: AC
  Filled 2021-06-19: qty 20

## 2021-06-19 MED ORDER — FENTANYL CITRATE (PF) 100 MCG/2ML IJ SOLN
INTRAMUSCULAR | Status: DC | PRN
  Administered 2021-06-19: 100 ug via INTRAVENOUS
  Administered 2021-06-19 (×2): 50 ug via INTRAVENOUS

## 2021-06-19 MED ORDER — SODIUM CHLORIDE 0.9% FLUSH: 3.0000 mL | INTRAVENOUS | Status: DC | PRN

## 2021-06-19 MED ORDER — HYDROMORPHONE HCL 1 MG/ML IJ SOLN
0.2500 mg | INTRAMUSCULAR | Status: DC | PRN
  Administered 2021-06-19: 0.5 mg via INTRAVENOUS
  Administered 2021-06-19: 0.25 mg via INTRAVENOUS

## 2021-06-19 MED ORDER — MIDAZOLAM HCL 5 MG/5ML IJ SOLN
INTRAMUSCULAR | Status: DC | PRN
  Administered 2021-06-19: 2 mg via INTRAVENOUS

## 2021-06-19 MED ORDER — LIDOCAINE HCL (CARDIAC) PF 100 MG/5ML IV SOSY
PREFILLED_SYRINGE | INTRAVENOUS | Status: DC | PRN
  Administered 2021-06-19: 60 mg via INTRAVENOUS

## 2021-06-19 MED ORDER — DEXAMETHASONE SODIUM PHOSPHATE 4 MG/ML IJ SOLN
INTRAMUSCULAR | Status: DC | PRN
  Administered 2021-06-19: 10 mg via INTRAVENOUS

## 2021-06-19 MED ORDER — ONDANSETRON HCL 4 MG/2ML IJ SOLN
INTRAMUSCULAR | Status: DC | PRN
  Administered 2021-06-19: 4 mg via INTRAVENOUS

## 2021-06-19 MED ORDER — PROPOFOL 10 MG/ML IV BOLUS
INTRAVENOUS | Status: DC | PRN
  Administered 2021-06-19: 200 mg via INTRAVENOUS

## 2021-06-19 MED ORDER — ALBUTEROL SULFATE HFA 108 (90 BASE) MCG/ACT IN AERS
INHALATION_SPRAY | RESPIRATORY_TRACT | Status: DC | PRN
  Administered 2021-06-19 (×2): 2 via RESPIRATORY_TRACT

## 2021-06-19 MED ORDER — PROMETHAZINE HCL 25 MG/ML IJ SOLN: 6.2500 mg | INTRAMUSCULAR | Status: DC | PRN

## 2021-06-19 MED ORDER — CEFAZOLIN SODIUM-DEXTROSE 2-4 GM/100ML-% IV SOLN
2.0000 g | INTRAVENOUS | Status: AC
  Administered 2021-06-19: 2 g via INTRAVENOUS

## 2021-06-19 MED ORDER — AMISULPRIDE (ANTIEMETIC) 5 MG/2ML IV SOLN: 10.0000 mg | Freq: Once | INTRAVENOUS | Status: DC | PRN

## 2021-06-19 MED ORDER — SODIUM CHLORIDE 0.9% FLUSH: 3.0000 mL | Freq: Two times a day (BID) | INTRAVENOUS | Status: DC

## 2021-06-19 MED ORDER — CHLORHEXIDINE GLUCONATE CLOTH 2 % EX PADS: 6.0000 | MEDICATED_PAD | Freq: Once | CUTANEOUS | Status: DC

## 2021-06-19 MED ORDER — LIDOCAINE-EPINEPHRINE 1 %-1:100000 IJ SOLN
INTRAMUSCULAR | Status: DC | PRN
  Administered 2021-06-19: 7 mL via INTRAMUSCULAR

## 2021-06-19 MED ORDER — CEFAZOLIN SODIUM-DEXTROSE 2-4 GM/100ML-% IV SOLN
INTRAVENOUS | Status: AC
  Filled 2021-06-19: qty 100

## 2021-06-19 SURGICAL SUPPLY — 46 items
ADH SKN CLS APL DERMABOND .7 (GAUZE/BANDAGES/DRESSINGS) ×2
BAG DECANTER FOR FLEXI CONT (MISCELLANEOUS) ×2 IMPLANT
BINDER BREAST XLRG (GAUZE/BANDAGES/DRESSINGS) ×2 IMPLANT
BLADE HEX COATED 2.75 (ELECTRODE) ×2 IMPLANT
BLADE SURG 15 STRL LF DISP TIS (BLADE) ×1 IMPLANT
BLADE SURG 15 STRL SS (BLADE) ×2
CANISTER SUCT 1200ML W/VALVE (MISCELLANEOUS) ×4 IMPLANT
COVER BACK TABLE 60X90IN (DRAPES) ×2 IMPLANT
COVER MAYO STAND STRL (DRAPES) ×2 IMPLANT
DERMABOND ADVANCED (GAUZE/BANDAGES/DRESSINGS) ×2
DERMABOND ADVANCED .7 DNX12 (GAUZE/BANDAGES/DRESSINGS) ×2 IMPLANT
DRAPE LAPAROSCOPIC ABDOMINAL (DRAPES) ×2 IMPLANT
DRSG OPSITE POSTOP 4X6 (GAUZE/BANDAGES/DRESSINGS) ×4 IMPLANT
DRSG PAD ABDOMINAL 8X10 ST (GAUZE/BANDAGES/DRESSINGS) ×4 IMPLANT
ELECT BLADE 4.0 EZ CLEAN MEGAD (MISCELLANEOUS) ×2
ELECT REM PT RETURN 9FT ADLT (ELECTROSURGICAL) ×2
ELECTRODE BLDE 4.0 EZ CLN MEGD (MISCELLANEOUS) ×1 IMPLANT
ELECTRODE REM PT RTRN 9FT ADLT (ELECTROSURGICAL) ×1 IMPLANT
FUNNEL KELLER 2 DISP (MISCELLANEOUS) ×2 IMPLANT
GLOVE SURG ENC MOIS LTX SZ6.5 (GLOVE) ×8 IMPLANT
GOWN STRL REUS W/ TWL LRG LVL3 (GOWN DISPOSABLE) ×1 IMPLANT
GOWN STRL REUS W/TWL LRG LVL3 (GOWN DISPOSABLE) ×2
IMPL GEL HP 590CC (Breast) ×2 IMPLANT
IMPLANT GEL HP 590CC (Breast) ×4 IMPLANT
NDL SAFETY ECLIPSE 18X1.5 (NEEDLE) ×1 IMPLANT
NEEDLE HYPO 18GX1.5 SHARP (NEEDLE) ×2
NEEDLE HYPO 25X1 1.5 SAFETY (NEEDLE) ×2 IMPLANT
PACK BASIN DAY SURGERY FS (CUSTOM PROCEDURE TRAY) ×2 IMPLANT
PENCIL SMOKE EVACUATOR (MISCELLANEOUS) ×2 IMPLANT
SIZER BREAST REUSE 590CC (SIZER) ×2
SIZER BRST REUSE 590CC (SIZER) ×1 IMPLANT
SLEEVE SCD COMPRESS KNEE MED (STOCKING) ×2 IMPLANT
SPONGE T-LAP 18X18 ~~LOC~~+RFID (SPONGE) ×4 IMPLANT
SUT MNCRL AB 4-0 PS2 18 (SUTURE) ×4 IMPLANT
SUT MON AB 3-0 SH 27 (SUTURE) ×4
SUT MON AB 3-0 SH27 (SUTURE) ×2 IMPLANT
SUT PDS 3-0 CT2 (SUTURE) ×4
SUT PDS II 3-0 CT2 27 ABS (SUTURE) ×2 IMPLANT
SYR BULB IRRIG 60ML STRL (SYRINGE) ×2 IMPLANT
SYR CONTROL 10ML LL (SYRINGE) ×2 IMPLANT
TOWEL GREEN STERILE FF (TOWEL DISPOSABLE) ×4 IMPLANT
TRAY DSU PREP LF (CUSTOM PROCEDURE TRAY) ×2 IMPLANT
TUBE CONNECTING 20X1/4 (TUBING) ×2 IMPLANT
TUBING SET GRADUATE ASPIR 12FT (MISCELLANEOUS) ×2 IMPLANT
UNDERPAD 30X36 HEAVY ABSORB (UNDERPADS AND DIAPERS) ×4 IMPLANT
YANKAUER SUCT BULB TIP NO VENT (SUCTIONS) ×2 IMPLANT

## 2021-06-19 NOTE — Anesthesia Postprocedure Evaluation (Signed)
Anesthesia Post Note  Patient: Ebony Watson  Procedure(s) Performed: bilateral removal of expanders and placement of implants (Bilateral: Breast) LIPOSUCTION (Bilateral: Breast)     Patient location during evaluation: PACU Anesthesia Type: General Level of consciousness: awake and alert Pain management: pain level controlled Vital Signs Assessment: post-procedure vital signs reviewed and stable Respiratory status: spontaneous breathing, nonlabored ventilation and respiratory function stable Cardiovascular status: blood pressure returned to baseline and stable Postop Assessment: no apparent nausea or vomiting Anesthetic complications: no   No notable events documented.  Last Vitals:  Vitals:   06/19/21 1415 06/19/21 1430  BP: 134/87 134/74  Pulse: 83 81  Resp: 19 12  Temp:    SpO2: 98% 97%    Last Pain:  Vitals:   06/19/21 1455  TempSrc:   PainSc: 4                  Lynda Rainwater

## 2021-06-19 NOTE — Op Note (Signed)
Op report Bilateral Exchange   DATE OF OPERATION: 06/19/2021  LOCATION: [facility]  SURGICAL DIVISION: Plastic Surgery  PREOPERATIVE DIAGNOSIS:  1.History of breast cancer.  2. Acquired absence of bilateral breast.   POSTOPERATIVE DIAGNOSIS:  1. History of breast cancer.  2. Acquired absence of bilateral breast.   PROCEDURE:  1. Bilateral exchange of tissue expanders for implants.  2. Bilateral capsulotomies for implant respositioning.  SURGEON: Zahraa Bhargava Sanger Kathlynn Swofford, DO  ASSISTANT: Lennice Sites, MD, Krista Blue, PA  ANESTHESIA:  General.   COMPLICATIONS: None.   IMPLANTS: Left - Mentor Smooth Round Ultra High Profile Gel 590cc. Ref #[id number].  Serial Number [id number] Right - Mentor Smooth Round Ultra High Profile Gel 590cc. Ref #[id number].  Serial Number [id number]  INDICATIONS FOR PROCEDURE:  The patient, Ebony Watson, is a 41 y.o. female born on Dec 12, 1980, is here for treatment after bilateral mastectomies.  She had tissue expanders placed at the time of mastectomies. She now presents for exchange of her expanders for implants.  She requires capsulotomies to better position the implants. MRN: [id number]  CONSENT:  Informed consent was obtained directly from the patient. Risks, benefits and alternatives were fully discussed. Specific risks including but not limited to bleeding, infection, hematoma, seroma, scarring, pain, implant infection, implant extrusion, capsular contracture, asymmetry, wound healing problems, and need for further surgery were all discussed. The patient did have an ample opportunity to have her questions answered to her satisfaction.   DESCRIPTION OF PROCEDURE:  The patient was taken to the operating room. SCDs were placed and IV antibiotics were given. The patient's chest was prepped and draped in a sterile fashion. A time out was performed and the implants to be used were identified.    On the right breast: One  percent Lidocaine with epinephrine was used to infiltrate at the incision site. The old mastectomy scar was incised.  The mastectomy flaps from the superior and inferior flaps were raised over the pectoralis major muscle for several centimeters to minimize tension for the closure. The pectoralis was split inferior to the skin incision to expose and remove the tissue expander.  Inspection of the pocket showed a normal healthy capsule and good integration of the biologic matrix.  The pocket was irrigated with antibiotic solution.  Circumferential capsulotomies were performed to allow for breast pocket expansion.  Measurements were made and a sizer used to confirm adequate pocket size for the implant dimensions.  Hemostasis was ensured with electrocautery. New gloves were placed. The implant was soaked in antibiotic solution and then placed in the pocket and oriented appropriately. The pectoralis major muscle and capsule on the anterior surface were re-closed with a 3-0 PDS and Monocryl suture. The remaining skin was closed with 3-0 Monocryl deep dermal and 4-0 Monocryl subcuticular stitches.   On the left breast: The old mastectomy scar was incised.  The mastectomy flaps from the superior and inferior flaps were raised over the pectoralis major muscle for several centimeters to minimize tension for the closure. The pectoralis was split inferior to the skin incision to expose and remove the tissue expander.  Inspection of the pocket showed a normal healthy capsule and good integration of the biologic matrix.   Circumferential capsulotomies were performed to allow for breast pocket expansion.  Measurements were made and a sizer utilized to confirm adequate pocket size for the implant dimensions.  Hemostasis was ensured with the electrocautery.  New gloves were applied. The implant was soaked in antibiotic solution and placed in  the pocket and oriented appropriately. The pectoralis major muscle and capsule on the  anterior surface were re-closed with a 3-0 PDS and Monocryl suture. The remaining skin was closed with 3-0 Monocryl deep dermal and 4-0 Monocryl subcuticular stitches.  Dermabond was applied to the incision site. A breast binder and ABDs were placed.  The patient was allowed to wake from anesthesia and taken to the recovery room in satisfactory condition.   The advanced practice practitioner (APP) assisted throughout the case.  The APP was essential in retraction and counter traction when needed to make the case progress smoothly.  This retraction and assistance made it possible to see the tissue plans for the procedure.  The assistance was needed for blood control, tissue re-approximation and assisted with closure of the incision site.

## 2021-06-19 NOTE — Anesthesia Preprocedure Evaluation (Signed)
Anesthesia Evaluation  Patient identified by MRN, date of birth, ID band Patient awake    Reviewed: Allergy & Precautions, NPO status , Patient's Chart, lab work & pertinent test results  Airway Mallampati: II  TM Distance: >3 FB Neck ROM: Full    Dental no notable dental hx.    Pulmonary asthma , former smoker,    Pulmonary exam normal breath sounds clear to auscultation       Cardiovascular negative cardio ROS Normal cardiovascular exam Rhythm:Regular Rate:Normal     Neuro/Psych PSYCHIATRIC DISORDERS Anxiety Depression negative neurological ROS     GI/Hepatic negative GI ROS, Neg liver ROS,   Endo/Other  negative endocrine ROS  Renal/GU negative Renal ROS  negative genitourinary   Musculoskeletal negative musculoskeletal ROS (+)   Abdominal (+) + obese,   Peds negative pediatric ROS (+)  Hematology negative hematology ROS (+)   Anesthesia Other Findings   Reproductive/Obstetrics negative OB ROS                             Anesthesia Physical  Anesthesia Plan  ASA: 3  Anesthesia Plan: General   Post-op Pain Management:    Induction: Intravenous  PONV Risk Score and Plan: 3 and Treatment may vary due to age or medical condition, Ondansetron, Dexamethasone and Midazolam  Airway Management Planned: LMA  Additional Equipment: None  Intra-op Plan:   Post-operative Plan: Extubation in OR  Informed Consent: I have reviewed the patients History and Physical, chart, labs and discussed the procedure including the risks, benefits and alternatives for the proposed anesthesia with the patient or authorized representative who has indicated his/her understanding and acceptance.     Dental advisory given  Plan Discussed with: CRNA and Anesthesiologist  Anesthesia Plan Comments:         Anesthesia Quick Evaluation

## 2021-06-19 NOTE — Discharge Instructions (Addendum)
INSTRUCTIONS FOR AFTER SURGERY   You will likely have some questions about what to expect following your operation.  The following information will help you and your family understand what to expect when you are discharged from the hospital.  Following these guidelines will help ensure a smooth recovery and reduce risks of complications.  Postoperative instructions include information on: diet, wound care, medications and physical activity.  AFTER SURGERY Expect to go home after the procedure.  In some cases, you may need to spend one night in the hospital for observation.  DIET This surgery does not require a specific diet.  However, I have to mention that the healthier you eat the better your body can start healing. It is important to increasing your protein intake.  This means limiting the foods with added sugar.  Focus on fruits and vegetables and some meat. It is very important to drink water after your surgery.  If your urine is bright yellow, then it is concentrated, and you need to drink more water.  As a general rule after surgery, you should have 8 ounces of water every hour while awake.  If you find you are persistently nauseated or unable to take in liquids let us know.  NO TOBACCO USE or EXPOSURE.  This will slow your healing process and increase the risk of a wound.  WOUND CARE You can shower the day after surgery.  Use fragrance free soap.  Dial, [place], [place] and Cetaphil are usually mild on the skin.  If you have steri-strips / tape directly attached to your skin leave them in place. It is OK to get these wet.  No baths, pools or hot tubs for two weeks. We close your incision to leave the smallest and best-looking scar. No ointment or creams on your incisions until given the go ahead.  Especially not Neosporin (Too many skin reactions with this one).  A few weeks after surgery you can use Mederma and start massaging the scar. We ask you to wear your binder or sports bra for the first 6  weeks around the clock, including while sleeping. This provides added comfort and helps reduce the fluid accumulation at the surgery site.  ACTIVITY No heavy lifting until cleared by the doctor.  It is OK to walk and climb stairs. In fact, moving your legs is very important to decrease your risk of a blood clot.  It will also help keep you from getting deconditioned.  Every 1 to 2 hours get up and walk for 5 minutes. This will help with a quicker recovery back to normal.  Let pain be your guide so you don't do too much.  NO, you cannot do the spring cleaning and don't plan on taking care of anyone else.  This is your time for TLC.   WORK Everyone returns to work at different times. As a rough guide, most people take at least 1 - 2 weeks off prior to returning to work. If you need documentation for your job, bring the forms to your postoperative follow up visit.  DRIVING Arrange for someone to bring you home from the hospital.  You may be able to drive a few days after surgery but not while taking any narcotics or valium.  BOWEL MOVEMENTS Constipation can occur after anesthesia and while taking pain medication.  It is important to stay ahead for your comfort.  We recommend taking Milk of Magnesia (2 tablespoons; twice a day) while taking the pain pills.  SEROMA This  is fluid your body tried to put in the surgical site.  This is normal but if it creates excessive pain and swelling let us know.  It usually decreases in a few weeks.  MEDICATIONS and PAIN CONTROL At your preoperative visit for you history and physical you were given the following medications: An antibiotic: Start this medication when you get home and take according to the instructions on the bottle. Zofran 4 mg:  This is to treat nausea and vomiting.  You can take this every 6 hours as needed and only if needed. Norco (hydrocodone/acetaminophen) 5/325 mg:  This is only to be used after you have taken the motrin or the tylenol. Every  8 hours as needed. Over the counter Medication to take: Ibuprofen (Motrin) 600 mg:  Take this every 6 hours.  If you have additional pain then take 500 mg of the tylenol.  Only take the Norco after you have tried these two. Miralax or stool softener of choice: Take this according to the bottle if you take the [place] Call your surgeon's office if any of the following occur:  Fever 101 degrees F or greater  Excessive bleeding or fluid from the incision site.  Pain that increases over time without aid from the medications  Redness, warmth, or pus draining from incision sites  Persistent nausea or inability to take in liquids  Severe misshapen area that underwent the operation.   Post Anesthesia Home Care Instructions  Activity: Get plenty of rest for the remainder of the day. A responsible individual must stay with you for 24 hours following the procedure.  For the next 24 hours, DO NOT: -Drive a car -Paediatric nurse -Drink alcoholic beverages -Take any medication unless instructed by your physician -Make any legal decisions or sign important papers.  Meals: Start with liquid foods such as gelatin or soup. Progress to regular foods as tolerated. Avoid greasy, spicy, heavy foods. If nausea and/or vomiting occur, drink only clear liquids until the nausea and/or vomiting subsides. Call your physician if vomiting continues.  Special Instructions/Symptoms: Your throat may feel dry or sore from the anesthesia or the breathing tube placed in your throat during surgery. If this causes discomfort, gargle with warm salt water. The discomfort should disappear within 24 hours.  If you had a scopolamine patch placed behind your ear for the management of post- operative nausea and/or vomiting:  1. The medication in the patch is effective for 72 hours, after which it should be removed.  Wrap patch in a tissue and discard in the trash. Wash hands thoroughly with soap and water. 2. You  may remove the patch earlier than 72 hours if you experience unpleasant side effects which may include dry mouth, dizziness or visual disturbances. 3. Avoid touching the patch. Wash your hands with soap and water after contact with the patch.

## 2021-06-19 NOTE — Anesthesia Procedure Notes (Signed)
Procedure Name: LMA Insertion Date/Time: 06/19/2021 12:42 PM Performed by: Willa Frater, CRNA Pre-anesthesia Checklist: Patient identified, Emergency Drugs available, Suction available and Patient being monitored Patient Re-evaluated:Patient Re-evaluated prior to induction Oxygen Delivery Method: Circle system utilized Preoxygenation: Pre-oxygenation with 100% oxygen Induction Type: IV induction Ventilation: Mask ventilation without difficulty LMA: LMA inserted LMA Size: 4.0 Number of attempts: 1 Airway Equipment and Method: Bite block Placement Confirmation: positive ETCO2 Tube secured with: Tape Dental Injury: Teeth and Oropharynx as per pre-operative assessment

## 2021-06-19 NOTE — Transfer of Care (Signed)
Immediate Anesthesia Transfer of Care Note  Patient: Ebony Watson  Procedure(s) Performed: bilateral removal of expanders and placement of implants (Bilateral: Breast) LIPOSUCTION (Bilateral: Breast)  Patient Location: PACU  Anesthesia Type:General  Level of Consciousness: awake, alert  and oriented  Airway & Oxygen Therapy: Patient Spontanous Breathing and Patient connected to face mask oxygen  Post-op Assessment: Report given to RN and Post -op Vital signs reviewed and stable  Post vital signs: Reviewed and stable  Last Vitals:  Vitals Value Taken Time  BP 130/81 06/19/21 1406  Temp    Pulse 84 06/19/21 1412  Resp 21 06/19/21 1412  SpO2 99 % 06/19/21 1412  Vitals shown include unvalidated device data.  Last Pain:  Vitals:   06/19/21 1146  TempSrc: Oral  PainSc: 0-No pain      Patients Stated Pain Goal: 5 (06/22/12 1438)  Complications: No notable events documented.

## 2021-06-19 NOTE — Interval H&P Note (Signed)
History and Physical Interval Note:  06/19/2021 12:00 PM  Ebony Watson  has presented today for surgery, with the diagnosis of Status post partial mastectomy, bilateral.  The various methods of treatment have been discussed with the patient and family. After consideration of risks, benefits and other options for treatment, the patient has consented to  Procedure(s): bilateral removal of expanders and placement of implants (Bilateral) as a surgical intervention.  The patient's history has been reviewed, patient examined, no change in status, stable for surgery.  I have reviewed the patient's chart and labs.  Questions were answered to the patient's satisfaction.     Ebony Watson Ebony Watson

## 2021-06-25 ENCOUNTER — Encounter (HOSPITAL_BASED_OUTPATIENT_CLINIC_OR_DEPARTMENT_OTHER): Payer: Self-pay | Admitting: Plastic Surgery

## 2021-06-27 ENCOUNTER — Ambulatory Visit (INDEPENDENT_AMBULATORY_CARE_PROVIDER_SITE_OTHER): Payer: BC Managed Care – PPO | Admitting: Plastic Surgery

## 2021-06-27 ENCOUNTER — Other Ambulatory Visit: Payer: Self-pay

## 2021-06-27 ENCOUNTER — Encounter: Payer: Self-pay | Admitting: Plastic Surgery

## 2021-06-27 DIAGNOSIS — Z170 Estrogen receptor positive status [ER+]: Secondary | ICD-10-CM

## 2021-06-27 DIAGNOSIS — Z9010 Acquired absence of unspecified breast and nipple: Secondary | ICD-10-CM | POA: Insufficient documentation

## 2021-06-27 DIAGNOSIS — Z9013 Acquired absence of bilateral breasts and nipples: Secondary | ICD-10-CM

## 2021-06-27 DIAGNOSIS — C50212 Malignant neoplasm of upper-inner quadrant of left female breast: Secondary | ICD-10-CM

## 2021-06-27 MED ORDER — ZOLPIDEM TARTRATE ER 6.25 MG PO TBCR: 6.2500 mg | EXTENDED_RELEASE_TABLET | Freq: Every evening | ORAL | 0 refills | Status: DC | PRN

## 2021-06-27 NOTE — Progress Notes (Signed)
   Subjective:    Patient ID: Ebony Watson, female    DOB: 04-15-1981, 40 y.o.   MRN: [id number]  The patient is a 40 year old female here for follow-up after undergoing exchange of her expanders for implants.  She has 590 cc ultrahigh profile implants in place.  She is doing well physically.  She has a quite a bit of bruising as expected and swelling.  No sign of a hematoma or seroma.  She is tearful today and disappointed that it does not look better.   Review of Systems  Constitutional:  Positive for activity change.  Eyes: Negative.   Respiratory: Negative.    Cardiovascular: Negative.   Endocrine: Negative.   Genitourinary: Negative.   Skin:  Positive for color change.      Objective:   Physical Exam Constitutional:      Appearance: Normal appearance.  Cardiovascular:     Rate and Rhythm: Normal rate.     Pulses: Normal pulses.  Skin:    Capillary Refill: Capillary refill takes less than 2 seconds.  Neurological:     Mental Status: She is alert and oriented to person, place, and time.       Assessment & Plan:     ICD-10-CM   1. Status post partial mastectomy, bilateral  Z90.13     2. Malignant neoplasm of upper-inner quadrant of left breast in female, estrogen receptor positive ([place])  C50.212    Z17.0     3. Acquired absence of both breasts  Z90.13       Patient can go to sports bra and showers.  She will take the honeycomb off if it gets wet on the inside.  She can start doing some massages of the implants in the inferior direction.  She can start back to some activities of walking and biking.  In the next few weeks she can start running and lifting.  I tried to give her some encouragement that the implants will improve and they will get a better shape to.  It will take time and it might take some fat grafting.  I offered her to call me anytime and I will be happy to call her back or she can come in earlier otherwise we will see her back in about a week to  remove the dressings.  I also offered her patient support and right now she said she is okay she has an aunt that is gone through this who she has been able to talk to.  I will send in some Ambien to help her with a little better sleep.

## 2021-07-11 ENCOUNTER — Ambulatory Visit (INDEPENDENT_AMBULATORY_CARE_PROVIDER_SITE_OTHER): Payer: BC Managed Care – PPO | Admitting: Physician Assistant

## 2021-07-11 ENCOUNTER — Other Ambulatory Visit: Payer: Self-pay

## 2021-07-11 DIAGNOSIS — Z9889 Other specified postprocedural states: Secondary | ICD-10-CM

## 2021-07-11 NOTE — Progress Notes (Signed)
Patient is a 40 year old female with PMH of BRCA2 and malignant neoplasm of upper-inner quadrant left breast s/p reconstruction with expander placement 02/20/2021 and subsequent implant exchange 06/19/2021 by Dr. Marla Roe who presents to clinic today for postoperative follow-up.  She was last seen in the clinic on 06/27/2021.  At that time, she had typical postoperative bruising and swelling, but no obvious hematoma or seroma.  Her exam was largely reassuring.  She expressed disappointment with the cosmetic outcome.  Plan was for massaging of the implants inferiorly and slowly increasing activity as tolerated.  Implants will likely continue to settle and expect improved to shape and contour.  Plan is for dressing removal at subsequent appointment.  Today, patient returns to clinic.  She removed her dressings at home since they had gotten wet.  She states that there is some residual Dermabond that is causing her mild irritation, but denies any significant pain or other systemic symptoms.  She does however continue to be quite dissatisfied with her surgical outcome.  She complains of right breast superior pole fullness with flattening inferiorly.  She also does not like the closures medially and feels like there is excess skin.  She feels as though the projection is not where she had anticipated and wanted.  Patient also states that there is considerable animation with any movement.  Physical exam is reassuring from a healing standpoint.  Incisions appear to be healing well.  Old bruising noted.  No obvious subcutaneous fluid collections.  No cellulitic findings.  Mild residual Dermabond for which I am recommending application of Vaseline to help dissolve.  She does have considerable animation with any pectoral engagement.    Suspect that some of her right breast superior pole fullness and inferior flattening will improve with continued time and gravity.  She is still only a few weeks postop.  Patient already  has an appointment scheduled for next month with Dr. Marla Roe to discuss how contour and shape can be improved.  Recommending continued inferior massaging to help with right breast.  Left breast has better slope.  Picture(s) obtained of the patient and placed in the chart were with the patient's or guardian's permission.

## 2021-08-12 ENCOUNTER — Other Ambulatory Visit: Payer: Self-pay

## 2021-08-12 ENCOUNTER — Ambulatory Visit (INDEPENDENT_AMBULATORY_CARE_PROVIDER_SITE_OTHER): Payer: BC Managed Care – PPO | Admitting: Plastic Surgery

## 2021-08-12 ENCOUNTER — Encounter: Payer: Self-pay | Admitting: Plastic Surgery

## 2021-08-12 DIAGNOSIS — Z9013 Acquired absence of bilateral breasts and nipples: Secondary | ICD-10-CM

## 2021-08-12 NOTE — Progress Notes (Signed)
° °  Subjective:    Patient ID: Ebony Watson, female    DOB: February 01, 1981, 39 y.o.   MRN: [id number]  The patient is a 40 year old female here for follow-up after undergoing breast reconstruction.  She had breast cancer and underwent bilateral mastectomies with expander placement 2 months ago.  She has 590 cc Mentor ultra high silicone implants in place.  On her last exam she was concerned about how high the implants were resting and tight they were.  She was also concerned about the lack of projection.  Today there softer and may have dropped.  Her bruising has improved although she still has a little bit on the medial aspect of both breasts.  She also has a spot on the left lateral breast where one of the stitches likely a PDS is poking.  I have encouraged her to massage that area.  She knows to call me if it pokes through.   Review of Systems  Constitutional: Negative.   Eyes: Negative.   Respiratory: Negative.    Cardiovascular: Negative.   Gastrointestinal: Negative.   Endocrine: Negative.   Genitourinary: Negative.       Objective:   Physical Exam Constitutional:      Appearance: Normal appearance.  Cardiovascular:     Rate and Rhythm: Normal rate.     Pulses: Normal pulses.  Skin:    General: Skin is warm.     Capillary Refill: Capillary refill takes less than 2 seconds.     Coloration: Skin is not jaundiced.     Findings: Bruising present. No lesion.  Neurological:     Mental Status: She is alert.  Psychiatric:        Mood and Affect: Mood normal.        Behavior: Behavior normal.        Thought Content: Thought content normal.       Assessment & Plan:     ICD-10-CM   1. Status post partial mastectomy, bilateral  Z90.13     2. Acquired absence of both breasts  Z90.13       We discussed the next steps of letting the implant drop down and relax some more.  Then the option is fat grafting.  I reviewed the size chart and she does not gain much projection by  going larger but would have to deal with much wider implants.  We will discuss this more at her next appointment.  I think fat grafting may be the best option for more projection.  Pictures were obtained of the patient and placed in the chart with the patient's or guardian's permission.

## 2021-08-12 NOTE — Addendum Note (Signed)
Addended by: Harl Bowie on: 08/12/2021 12:06 PM   Modules accepted: Orders

## 2021-09-03 NOTE — Progress Notes (Signed)
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   Rest of the breast biopsies did not show any evidence of malignancy.   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   She had salpingo-oophorectomy given her BRCA2 mutation.  Since last visit, she also had bilateral mastectomy in June 2022. Pathology showed ductal carcinoma in situ with calcifications measuring 3.8 cm in the left breast, margins not involved, focal atypical lobular hyperplasia, all sentinel lymph nodes from the left axilla negative.  Right breast showed fibrocystic changes with usual ductal hyperplasia and focal sclerosing adenosis.  No evidence of malignancy.  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  She is returning after bilateral mastectomy to discuss additional recommendations. Given bilateral mastectomy, there is no additional role for adjuvant endocrine therapy or radiation.  We have recommended annual history and physical examination since she is high risk, given BRCA2 mutation.   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  She is here to discuss about management of these.  She has not otherwise noticed any changes in her breast, working with plastic surgery for good cosmetic outcome. Physical examination today unremarkable, no concerns for  breast cancer.  For surveillance standpoint, she can continue history and physical examination, no role for screening given bilateral mastectomy, no role for systemic imaging, no role for routine labs to detect breast cancer.  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  She is struggling to cope with this.  She has tried Effexor, gabapentin, clonidine, oxybutynin for management of vasomotor symptoms.  She tells me that she can live with the hot flashes although she ended up in the ER with palpitations and had of couple mental breakdowns when [place] the [place].  She is however now noticing major joint pains which are strongly limiting her from doing her activities and she is at the point that she almost wants to go back on some hormonal supplementation.  We have very clearly discussed that this would be an invitation to breast cancer given her BRCA2 mutation and she clearly understands that she may very well end up having to go through breast cancer treatment if she ends up on an estrogen supplementation.  I also can imagine impact of menopause that she is going through.  I have recommended that she work with our physical therapist, try acupuncture or needling, try regular exercise for a few weeks before she decides to try hormonal supplementation.  I have also clearly told her that I will not be willing to prescribe this given the risks associated with that and that she expressed understanding.  We will try to get her all the resources we can to avoid hormonal supplementation.  Our nurse Val we will give her a call later to discuss about other resources if possible to consider.  No symptoms or signs  concerning for pancreatic cancer today.  No role for screening given lack of family history of pancreatic cancer. She had some skin cancers and had these removed, working with dermatologist on a regular basis.  Since she may end up having to go on hormone supplementation and  since she is a very high risk for breast cancer, have asked her to return to clinic in 3 months.   HISTORY OF PRESENTING ILLNESS:  Ebony Watson 41 y.o. 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   INTERIM HISTORY  [name] is here for follow-up after her bilateral mastectomies. Since last visit, she has had real difficulty coping with menopause.  She has had mood lability, hot flashes, joint pains and is struggling to cope.  She ended up in the ER with an episode of palpitations, had couple mental breakdowns when she was driving and her husband had to pick her up.  She feels that she is not able to do her job well because of all the symptoms.  She is working with her gynecologist and was wondering if she can try some hormone supplementation despite her risks.  She has tried Effexor, gabapentin, clonidine, oxybutynin with no relief.  She does not want to go on pain medication for arthralgia management. Rest of the pertinent review of systems reviewed and negative.  MEDICAL HISTORY:  Past Medical History:  Diagnosis Date   Anxiety    Asthma    BRCA2 gene mutation positive in female    Depression    Hyperlipidemia     SURGICAL HISTORY: Past Surgical History:  Procedure Laterality Date   BREAST RECONSTRUCTION WITH PLACEMENT OF TISSUE EXPANDER AND FLEX HD (ACELLULAR HYDRATED DERMIS) Bilateral 02/20/2021   Procedure: BILATERAL IMMEDIATE BREAST RECONSTRUCTION WITH PLACEMENT OF TISSUE EXPANDER AND FLEX HD (ACELLULAR HYDRATED DERMIS);  Surgeon: Wallace Going, DO;  Location: [facility];  Service: Plastics;  Laterality: Bilateral;   LIPOSUCTION Bilateral 06/19/2021   Procedure: LIPOSUCTION;  Surgeon: Wallace Going, DO;  Location: [facility];  Service: Plastics;  Laterality: Bilateral;   MASTECTOMY W/ SENTINEL NODE BIOPSY Left 02/20/2021   Procedure: LEFT MASTECTOMY WITH SENTINEL LYMPH NODE  BIOPSY;  Surgeon: Jovita Kussmaul, MD;  Location: [facility];  Service: General;  Laterality: Left;   REMOVAL OF BILATERAL TISSUE EXPANDERS WITH PLACEMENT OF BILATERAL BREAST IMPLANTS Bilateral 06/19/2021   Procedure: bilateral removal of expanders and placement of implants;  Surgeon: Wallace Going, DO;  Location: [facility];  Service: Plastics;  Laterality: Bilateral;   TOTAL MASTECTOMY Right 02/20/2021   Procedure: RIGHT TOTAL MASTECTOMY;  Surgeon: Jovita Kussmaul, MD;  Location: [facility];  Service: General;  Laterality: Right;   TUBAL LIGATION     WISDOM TOOTH EXTRACTION     XI ROBOTIC ASSISTED OOPHORECTOMY Bilateral 12/10/2020   Procedure: XI ROBOTIC ASSISTED BILATERAL  OOPHORECTOMY,  INTRAUTERINE DEVICE (IUD) INSERTION MIRENA;  Surgeon: Lafonda Mosses, MD;  Location: [facility];  Service: Gynecology;  Laterality: Bilateral;    SOCIAL HISTORY: Social History   Socioeconomic History   Marital status: Single    Spouse name: Not on file   Number of children: Not on file   Years of education: Not on file   Highest education level: Not on file  Occupational History   Occupation: infection control  Tobacco Use   Smoking status: Former    Packs/day: 0.25  Years: 4.00    Pack years: 1.00    Types: Cigarettes    Quit date: 2007    Years since quitting: 16.0   Smokeless tobacco: Never  Vaping Use   Vaping Use: Never used  Substance and Sexual Activity   Alcohol use: Yes    Comment: socially   Drug use: Never   Sexual activity: Yes    Birth control/protection: Surgical  Other Topics Concern   Not on file  Social History Narrative   Not on file   Social Determinants of Health   Financial Resource Strain: Not on file  Food Insecurity: Not on file  Transportation Needs: Not on file  Physical Activity: Not on file  Stress: Not on file  Social Connections: Not on file  Intimate Partner Violence: Not on file    FAMILY  HISTORY: Family History  Problem Relation Age of Onset   Drug abuse Mother    Hypertension Mother    Hyperlipidemia Mother    Heart disease Father    Prostate cancer Father    Drug abuse Sister    Breast cancer Maternal Aunt    Ovarian cancer Maternal Aunt    BRCA 1/2 Maternal Aunt    Cervical cancer Sister    Drug abuse Sister     ALLERGIES:  is allergic to hydrocodone-acetaminophen, oxycodone-acetaminophen, and wound dressing adhesive.  MEDICATIONS:  Current Outpatient Medications  Medication Sig Dispense Refill   ADVAIR DISKUS 250-50 MCG/DOSE AEPB Inhale 1 puff into the lungs 2 (two) times daily.     albuterol (VENTOLIN HFA) 108 (90 Base) MCG/ACT inhaler Inhale 2 puffs into the lungs every 4 (four) hours as needed (Asthma).     Cholecalciferol 125 MCG (5000 UT) capsule Take 5,000 Units by mouth daily.     diazepam (VALIUM) 2 MG tablet Take 1 tablet (2 mg total) by mouth every 12 (twelve) hours as needed for muscle spasms. 20 tablet 0   loratadine (CLARITIN) 10 MG tablet Take 10 mg by mouth daily.     Multiple Vitamin (MULTI-VITAMIN DAILY PO) Take 1 tablet by mouth daily.     Omega-3 Fatty Acids (FISH OIL PO) Take 1 capsule by mouth daily.     Probiotic Product (PROBIOTIC DAILY PO) Take 1 tablet by mouth daily.     No current facility-administered medications for this visit.   PHYSICAL EXAMINATION: ECOG PERFORMANCE STATUS: 0 - Asymptomatic  Vitals:   09/04/21 0932  BP: (!) 148/73  Pulse: 98  Resp: 18  Temp: 98.1 F (36.7 C)  SpO2: 98%     Filed Weights   09/04/21 0932  Weight: 199 lb 12.8 oz (90.6 kg)    Physical Exam Constitutional:      Appearance: Normal appearance.  HENT:     Head: Normocephalic and atraumatic.  Cardiovascular:     Rate and Rhythm: Normal rate and regular rhythm.     Pulses: Normal pulses.     Heart sounds: Normal heart sounds.  Pulmonary:     Effort: Pulmonary effort is normal.     Breath sounds: Normal breath sounds.  Chest:      Comments: Bilateral breasts inspected, no concerns for masses or recurrence.  No regional adenopathy noted.  She is status post bilateral mastectomy and surgical reconstruction. Abdominal:     General: Abdomen is flat.     Palpations: Abdomen is soft.  Musculoskeletal:        General: Normal range of motion.     Cervical back: Normal range  of motion and neck supple. No rigidity.  Lymphadenopathy:     Cervical: No cervical adenopathy.  Skin:    General: Skin is warm.  Neurological:     General: No focal deficit present.     Mental Status: She is alert.  Psychiatric:        Mood and Affect: Mood normal.    LABORATORY DATA:  I have reviewed the data as listed Lab Results  Component Value Date   WBC 5.7 12/02/2020   HGB 14.6 12/02/2020   HCT 43.6 12/02/2020   MCV 96.0 12/02/2020   PLT 245 12/02/2020     Chemistry      Component Value Date/Time   NA 136 12/02/2020 0933   K 3.7 12/02/2020 0933   CL 104 12/02/2020 0933   CO2 23 12/02/2020 0933   BUN 19 12/02/2020 0933   CREATININE 0.64 12/02/2020 0933      Component Value Date/Time   CALCIUM 9.2 12/02/2020 0933       RADIOGRAPHIC STUDIES: I have personally reviewed the radiological images as listed and agreed with the findings in the report. No results found.   All questions were answered. The patient knows to call the clinic with any problems, questions or concerns. I spent 40 minutes in the care of this patient including reviewing records, discussion about management of vasomotor symptoms and other menopausal symptoms, physical examination, counseling about other risks from BRCA2 mutation.   Benay Pike, MD 09/04/2021 10:19 AM

## 2021-09-04 ENCOUNTER — Inpatient Hospital Stay: Payer: BC Managed Care – PPO | Admitting: Hematology and Oncology

## 2021-09-04 ENCOUNTER — Other Ambulatory Visit: Payer: Self-pay

## 2021-09-04 ENCOUNTER — Inpatient Hospital Stay: Payer: BC Managed Care – PPO | Attending: Hematology and Oncology | Admitting: Hematology and Oncology

## 2021-09-04 ENCOUNTER — Encounter: Payer: Self-pay | Admitting: Hematology and Oncology

## 2021-09-04 VITALS — BP 148/73 | HR 98 | Temp 98.1°F | Resp 18 | Ht 67.0 in | Wt 199.8 lb

## 2021-09-04 DIAGNOSIS — Z1502 Genetic susceptibility to malignant neoplasm of ovary: Secondary | ICD-10-CM | POA: Diagnosis not present

## 2021-09-04 DIAGNOSIS — Z85828 Personal history of other malignant neoplasm of skin: Secondary | ICD-10-CM | POA: Insufficient documentation

## 2021-09-04 DIAGNOSIS — Z803 Family history of malignant neoplasm of breast: Secondary | ICD-10-CM | POA: Insufficient documentation

## 2021-09-04 DIAGNOSIS — Z1501 Genetic susceptibility to malignant neoplasm of breast: Secondary | ICD-10-CM | POA: Diagnosis not present

## 2021-09-04 DIAGNOSIS — Z87891 Personal history of nicotine dependence: Secondary | ICD-10-CM | POA: Diagnosis not present

## 2021-09-04 DIAGNOSIS — Z1509 Genetic susceptibility to other malignant neoplasm: Secondary | ICD-10-CM | POA: Diagnosis not present

## 2021-09-04 DIAGNOSIS — Z8042 Family history of malignant neoplasm of prostate: Secondary | ICD-10-CM | POA: Insufficient documentation

## 2021-09-04 DIAGNOSIS — D0512 Intraductal carcinoma in situ of left breast: Secondary | ICD-10-CM | POA: Diagnosis present

## 2021-09-04 DIAGNOSIS — N951 Menopausal and female climacteric states: Secondary | ICD-10-CM | POA: Diagnosis not present

## 2021-09-04 DIAGNOSIS — Z808 Family history of malignant neoplasm of other organs or systems: Secondary | ICD-10-CM | POA: Insufficient documentation

## 2021-09-04 DIAGNOSIS — Z8041 Family history of malignant neoplasm of ovary: Secondary | ICD-10-CM | POA: Insufficient documentation

## 2021-09-05 ENCOUNTER — Encounter: Payer: Self-pay | Admitting: Plastic Surgery

## 2021-11-17 DIAGNOSIS — G4700 Insomnia, unspecified: Secondary | ICD-10-CM | POA: Insufficient documentation

## 2021-11-17 DIAGNOSIS — J301 Allergic rhinitis due to pollen: Secondary | ICD-10-CM | POA: Insufficient documentation

## 2021-11-21 ENCOUNTER — Ambulatory Visit: Payer: BC Managed Care – PPO | Admitting: Plastic Surgery

## 2021-12-05 ENCOUNTER — Inpatient Hospital Stay: Payer: BC Managed Care – PPO | Attending: Hematology and Oncology | Admitting: Hematology and Oncology

## 2021-12-05 ENCOUNTER — Other Ambulatory Visit: Payer: Self-pay

## 2021-12-05 ENCOUNTER — Encounter: Payer: Self-pay | Admitting: Hematology and Oncology

## 2021-12-05 VITALS — BP 115/82 | HR 66 | Temp 97.7°F | Resp 20 | Wt 200.8 lb

## 2021-12-05 DIAGNOSIS — Z809 Family history of malignant neoplasm, unspecified: Secondary | ICD-10-CM | POA: Diagnosis not present

## 2021-12-05 DIAGNOSIS — Z87891 Personal history of nicotine dependence: Secondary | ICD-10-CM | POA: Insufficient documentation

## 2021-12-05 DIAGNOSIS — Z803 Family history of malignant neoplasm of breast: Secondary | ICD-10-CM | POA: Insufficient documentation

## 2021-12-05 DIAGNOSIS — Z853 Personal history of malignant neoplasm of breast: Secondary | ICD-10-CM | POA: Diagnosis present

## 2021-12-05 DIAGNOSIS — Z1501 Genetic susceptibility to malignant neoplasm of breast: Secondary | ICD-10-CM | POA: Diagnosis not present

## 2021-12-05 DIAGNOSIS — D0512 Intraductal carcinoma in situ of left breast: Secondary | ICD-10-CM

## 2021-12-05 DIAGNOSIS — Z1509 Genetic susceptibility to other malignant neoplasm: Secondary | ICD-10-CM

## 2021-12-05 DIAGNOSIS — Z8042 Family history of malignant neoplasm of prostate: Secondary | ICD-10-CM | POA: Diagnosis not present

## 2021-12-05 DIAGNOSIS — Z8041 Family history of malignant neoplasm of ovary: Secondary | ICD-10-CM | POA: Diagnosis not present

## 2021-12-05 NOTE — Progress Notes (Signed)
[facility] ?FOLLOW UP NOTE ? ?Patient Care Team: ?Alma Friendly, MD as PCP - General (Internal Medicine) ?Rockwell Germany, RN as Oncology Nurse Navigator ? ?CHIEF COMPLAINTS/PURPOSE OF CONSULTATION:  ?Abnormal breast biopsy, ? ?ASSESSMENT & PLAN:  ? 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   ?Rest of the breast biopsies did not show any evidence of malignancy.   ?The DCIS was noted to be ER and PR positive.   ?She had salpingo-oophorectomy given her BRCA2 mutation.  Since last visit, she also had bilateral mastectomy in June 2022. ?No role for antiestrogen therapy since she had bilateral mastectomy. ?She is now on estrogen supplementation, taking 1 mg once a day, hot flashes are better, mood is better. ?She said she couldn't cope at all with vasomotor symptoms, mood changes and she felt like she almost would lose her job with everything going on. ?She understands the risk of breast cancer especially with her BRCA 2 mutation and concomitant estrogen.   ?No concerns for breast cancer on exam today.  No role for mammograms or MRIs since she had bilateral mastectomy. ? ?No symptoms or signs concerning for pancreatic cancer today.   ?No role for screening given lack of family history of pancreatic cancer. ?Recommend annual dermatology visit. ? ?Return to clinic in 6 months. ? ?HISTORY OF PRESENTING ILLNESS:  ?Ebony Watson 41 y.o. female is here because of DCIS noted on breast biopsy ? ?Oncology History  ? No history exists.  ? ?INTERIM HISTORY ? ?Ebony Watson is here for follow-up after her bilateral mastectomies. ?Since last visit, she has had real difficulty  coping with menopause.  ?She finally started on estrogen supplementation 5 weeks ago at 1 mg once a day and she feels much better. ?On day 4, hot flashes have improved, mood improved. ?BP is better controlled. ?She tells me she understands all her risks and benefits but she would like to try it given the quality of life without any estrogen supplementation. ?Rest of the pertinent 10 point ROS reviewed and negative ? ?MEDICAL HISTORY:  ?Past Medical History:  ?Diagnosis Date  ? Anxiety   ? Asthma   ? BRCA2 gene mutation positive in female   ? Depression   ? Hyperlipidemia   ? ? ?SURGICAL HISTORY: ?Past Surgical History:  ?Procedure Laterality Date  ? BREAST RECONSTRUCTION WITH PLACEMENT OF TISSUE EXPANDER AND FLEX HD (ACELLULAR HYDRATED DERMIS) Bilateral 02/20/2021  ? Procedure: BILATERAL IMMEDIATE BREAST RECONSTRUCTION WITH PLACEMENT OF TISSUE EXPANDER AND FLEX HD (ACELLULAR HYDRATED DERMIS);  Surgeon: Wallace Going, DO;  Location: [facility];  Service: Plastics;  Laterality: Bilateral;  ? LIPOSUCTION Bilateral 06/19/2021  ? Procedure: LIPOSUCTION;  Surgeon: Wallace Going, DO;  Location: [facility];  Service: Plastics;  Laterality: Bilateral;  ? MASTECTOMY W/ SENTINEL NODE BIOPSY Left 02/20/2021  ? Procedure: LEFT MASTECTOMY WITH SENTINEL LYMPH NODE BIOPSY;  Surgeon: Jovita Kussmaul, MD;  Location: [facility];  Service: General;  Laterality: Left;  ? REMOVAL OF BILATERAL TISSUE EXPANDERS WITH PLACEMENT OF BILATERAL BREAST IMPLANTS Bilateral 06/19/2021  ? Procedure: bilateral removal of expanders and placement of implants;  Surgeon: Wallace Going,  DO;  Location: [facility];  Service: Plastics;  Laterality: Bilateral;  ? TOTAL MASTECTOMY Right 02/20/2021  ? Procedure: RIGHT TOTAL MASTECTOMY;  Surgeon: Jovita Kussmaul, MD;  Location: [facility];  Service: General;  Laterality: Right;  ? TUBAL LIGATION    ? WISDOM TOOTH EXTRACTION     ? XI ROBOTIC ASSISTED OOPHORECTOMY Bilateral 12/10/2020  ? Procedure: XI ROBOTIC ASSISTED BILATERAL  OOPHORECTOMY,  INTRAUTERINE DEVICE (IUD) INSERTION MIRENA;  Surgeon: Lafonda Mosses, MD;  Location: [facility];  Service: Gynecology;  Laterality: Bilateral;  ? ? ?SOCIAL HISTORY: ?Social History  ? ?Socioeconomic History  ? Marital status: Single  ?  Spouse name: Not on file  ? Number of children: Not on file  ? Years of education: Not on file  ? Highest education level: Not on file  ?Occupational History  ? Occupation: infection control  ?Tobacco Use  ? Smoking status: Former  ?  Packs/day: 0.25  ?  Years: 4.00  ?  Pack years: 1.00  ?  Types: Cigarettes  ?  Quit date: 2007  ?  Years since quitting: 16.2  ? Smokeless tobacco: Never  ?Vaping Use  ? Vaping Use: Never used  ?Substance and Sexual Activity  ? Alcohol use: Yes  ?  Comment: socially  ? Drug use: Never  ? Sexual activity: Yes  ?  Birth control/protection: Surgical  ?Other Topics Concern  ? Not on file  ?Social History Narrative  ? Not on file  ? ?Social Determinants of Health  ? ?Financial Resource Strain: Not on file  ?Food Insecurity: Not on file  ?Transportation Needs: Not on file  ?Physical Activity: Not on file  ?Stress: Not on file  ?Social Connections: Not on file  ?Intimate Partner Violence: Not on file  ? ? ?FAMILY HISTORY: ?Family History  ?Problem Relation Age of Onset  ? Drug abuse Mother   ? Hypertension Mother   ? Hyperlipidemia Mother   ? Heart disease Father   ? Prostate cancer Father   ? Drug abuse Sister   ? Breast cancer Maternal Aunt   ? Ovarian cancer Maternal Aunt   ? BRCA 1/2 Maternal Aunt   ? Cervical cancer Sister   ? Drug abuse Sister   ? ? ?ALLERGIES:  is allergic to hydrocodone-acetaminophen, oxycodone-acetaminophen, tape, and wound dressing adhesive. ? ?MEDICATIONS:  ?Current Outpatient Medications  ?Medication Sig Dispense Refill  ? ADVAIR DISKUS 250-50 MCG/DOSE AEPB Inhale 1 puff into the lungs 2 (two) times daily.    ?  albuterol (VENTOLIN HFA) 108 (90 Base) MCG/ACT inhaler Inhale 2 puffs into the lungs every 4 (four) hours as needed (Asthma).    ? Cholecalciferol 125 MCG (5000 UT) capsule Take 5,000 Units by mouth daily.    ? diazepam (VALIUM) 2 MG tablet Take 1 tablet (2 mg total) by mouth every 12 (twelve) hours as needed for muscle spasms. 20 tablet 0  ? loratadine (CLARITIN) 10 MG tablet Take 10 mg by mouth daily.    ? Multiple Vitamin (MULTI-VITAMIN DAILY PO) Take 1 tablet by mouth daily.    ? MULTIPLE VITAMINS-MINERALS ER PO Take 1 tablet by mouth daily.    ? Omega-3 Fatty Acids (FISH OIL PO) Take 1 capsule by mouth daily.    ? Probiotic Product (PROBIOTIC DAILY PO) Take 1 tablet by mouth daily.    ? ?No current facility-administered medications for this visit.  ? ?PHYSICAL EXAMINATION: ?ECOG PERFORMANCE STATUS: 0 - Asymptomatic ? ?Vitals:  ? 12/05/21  0912  ?BP: 115/82  ?Pulse: 66  ?Resp: 20  ?Temp: 97.7 ?F (36.5 ?C)  ?SpO2: 99%  ? ? ? ?Filed Weights  ? 12/05/21 0912  ?Weight: 200 lb 12.8 oz (91.1 kg)  ? ? ?Physical Exam ?Constitutional:   ?   Appearance: Normal appearance.  ?HENT:  ?   Head: Normocephalic and atraumatic.  ?Cardiovascular:  ?   Rate and Rhythm: Normal rate and regular rhythm.  ?   Pulses: Normal pulses.  ?   Heart sounds: Normal heart sounds.  ?Pulmonary:  ?   Effort: Pulmonary effort is normal.  ?   Breath sounds: Normal breath sounds.  ?Abdominal:  ?   General: Abdomen is flat.  ?   Palpations: Abdomen is soft.  ?Musculoskeletal:     ?   General: Normal range of motion.  ?   Cervical back: Normal range of motion and neck supple. No rigidity.  ?Lymphadenopathy:  ?   Cervical: No cervical adenopathy.  ?Skin: ?   General: Skin is warm.  ?Neurological:  ?   General: No focal deficit present.  ?   Mental Status: She is alert.  ?Psychiatric:     ?   Mood and Affect: Mood normal.  ?Breasts: No evidence of recurrence in the skin or presence of axillary lymphadenopathy or other regional adenopathy.  She is status  post bilateral mastectomy with reconstruction ? ?LABORATORY DATA:  ?I have reviewed the data as listed ?Lab Results  ?Component Value Date  ? WBC 5.7 12/02/2020  ? HGB 14.6 12/02/2020  ? HCT 43.6 04/11/2

## 2022-06-05 ENCOUNTER — Telehealth: Payer: Self-pay | Admitting: Hematology and Oncology

## 2022-06-05 ENCOUNTER — Encounter: Payer: Self-pay | Admitting: Hematology and Oncology

## 2022-06-05 ENCOUNTER — Inpatient Hospital Stay: Payer: BC Managed Care – PPO | Attending: Hematology and Oncology | Admitting: Hematology and Oncology

## 2022-06-05 DIAGNOSIS — Z87891 Personal history of nicotine dependence: Secondary | ICD-10-CM | POA: Insufficient documentation

## 2022-06-05 DIAGNOSIS — Z9013 Acquired absence of bilateral breasts and nipples: Secondary | ICD-10-CM | POA: Insufficient documentation

## 2022-06-05 DIAGNOSIS — D0512 Intraductal carcinoma in situ of left breast: Secondary | ICD-10-CM | POA: Insufficient documentation

## 2022-06-05 DIAGNOSIS — Z1501 Genetic susceptibility to malignant neoplasm of breast: Secondary | ICD-10-CM | POA: Insufficient documentation

## 2022-06-05 DIAGNOSIS — Z90722 Acquired absence of ovaries, bilateral: Secondary | ICD-10-CM | POA: Insufficient documentation

## 2022-06-05 DIAGNOSIS — Z8049 Family history of malignant neoplasm of other genital organs: Secondary | ICD-10-CM | POA: Insufficient documentation

## 2022-06-05 DIAGNOSIS — Z803 Family history of malignant neoplasm of breast: Secondary | ICD-10-CM | POA: Insufficient documentation

## 2022-06-05 DIAGNOSIS — Z8042 Family history of malignant neoplasm of prostate: Secondary | ICD-10-CM | POA: Insufficient documentation

## 2022-06-05 DIAGNOSIS — Z1502 Genetic susceptibility to malignant neoplasm of ovary: Secondary | ICD-10-CM | POA: Insufficient documentation

## 2022-06-05 DIAGNOSIS — Z8041 Family history of malignant neoplasm of ovary: Secondary | ICD-10-CM | POA: Insufficient documentation

## 2022-06-05 NOTE — Telephone Encounter (Signed)
Patient called in to cancel appointments for today. She will call back to reschedule. I have no showed appointment.

## 2022-06-10 ENCOUNTER — Telehealth: Payer: Self-pay | Admitting: Hematology and Oncology

## 2022-06-10 NOTE — Telephone Encounter (Signed)
R/s pt's appt time. Pt is aware.  

## 2022-06-12 ENCOUNTER — Other Ambulatory Visit: Payer: Self-pay

## 2022-06-12 ENCOUNTER — Inpatient Hospital Stay (HOSPITAL_BASED_OUTPATIENT_CLINIC_OR_DEPARTMENT_OTHER): Payer: BC Managed Care – PPO | Admitting: Hematology and Oncology

## 2022-06-12 ENCOUNTER — Inpatient Hospital Stay: Payer: BC Managed Care – PPO | Admitting: Hematology and Oncology

## 2022-06-12 ENCOUNTER — Encounter: Payer: Self-pay | Admitting: Hematology and Oncology

## 2022-06-12 VITALS — BP 135/74 | HR 74 | Temp 98.1°F | Resp 16 | Ht 67.0 in | Wt 203.8 lb

## 2022-06-12 DIAGNOSIS — Z90722 Acquired absence of ovaries, bilateral: Secondary | ICD-10-CM | POA: Diagnosis not present

## 2022-06-12 DIAGNOSIS — Z1501 Genetic susceptibility to malignant neoplasm of breast: Secondary | ICD-10-CM | POA: Diagnosis not present

## 2022-06-12 DIAGNOSIS — Z8042 Family history of malignant neoplasm of prostate: Secondary | ICD-10-CM | POA: Diagnosis not present

## 2022-06-12 DIAGNOSIS — Z1502 Genetic susceptibility to malignant neoplasm of ovary: Secondary | ICD-10-CM | POA: Diagnosis not present

## 2022-06-12 DIAGNOSIS — Z1509 Genetic susceptibility to other malignant neoplasm: Secondary | ICD-10-CM | POA: Diagnosis not present

## 2022-06-12 DIAGNOSIS — Z8049 Family history of malignant neoplasm of other genital organs: Secondary | ICD-10-CM | POA: Diagnosis not present

## 2022-06-12 DIAGNOSIS — Z803 Family history of malignant neoplasm of breast: Secondary | ICD-10-CM | POA: Diagnosis not present

## 2022-06-12 DIAGNOSIS — D0512 Intraductal carcinoma in situ of left breast: Secondary | ICD-10-CM | POA: Diagnosis not present

## 2022-06-12 DIAGNOSIS — Z87891 Personal history of nicotine dependence: Secondary | ICD-10-CM | POA: Diagnosis not present

## 2022-06-12 DIAGNOSIS — Z8041 Family history of malignant neoplasm of ovary: Secondary | ICD-10-CM | POA: Diagnosis not present

## 2022-06-12 DIAGNOSIS — Z9013 Acquired absence of bilateral breasts and nipples: Secondary | ICD-10-CM | POA: Diagnosis not present

## 2022-06-12 NOTE — Progress Notes (Signed)
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   Rest of the breast biopsies did not show any evidence of malignancy.   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   She had salpingo-oophorectomy given her BRCA2 mutation.  Since last visit, she also had bilateral mastectomy in June 2022. No role for antiestrogen therapy since she had bilateral mastectomy. She continues on estrogen supplementation takes 1 mg once a day, she feels so much better.  We have on several occasions discussed that estrogen supplementation is contraindicated in BRCA2 mutation and this may increase her risk of breast cancer recurrence.  She clearly understands all her risks and still chooses to continue with because of her quality of life after bilateral salpingo-oophorectomy.  On physical exam today, bilateral implants without any concern for recurrence.  She tells me that her mom died from some cancer which is not BRCA2 related. No symptoms or signs concerning for pancreatic cancer today.   No role for screening given lack of family history of pancreatic cancer. Recommend annual dermatology visit.  Return to clinic in 6 months or sooner as needed.  HISTORY OF PRESENTING ILLNESS:  Ebony Watson 41 y.o. 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   INTERIM HISTORY  [name] is here for follow-up after her bilateral mastectomies. She is now on estrogen supplementation 1 mg once a day.  She tells me that she talk to her PCP and her gynecologist who refused to prescribe.  She denies any changes in the breast area.  No other health changes.  She tells me that she understands her risks of estrogen supplementation including heart attacks, strokes and breast cancer but she tells me that her quality of life was miserable, she was unable to work she did not feel like she can live like that and hence chose to proceed with it.  She understands that she should be on the lowest dose possible.  Rest of the pertinent 10 point ROS reviewed and negative  MEDICAL HISTORY:  Past Medical History:  Diagnosis Date   Anxiety    Asthma    BRCA2 gene mutation positive in female    Depression    Hyperlipidemia     SURGICAL HISTORY: Past Surgical History:  Procedure Laterality Date   BREAST RECONSTRUCTION WITH PLACEMENT OF TISSUE EXPANDER AND FLEX HD (ACELLULAR HYDRATED DERMIS) Bilateral 02/20/2021   Procedure: BILATERAL IMMEDIATE BREAST RECONSTRUCTION WITH PLACEMENT OF TISSUE EXPANDER AND FLEX HD (ACELLULAR HYDRATED DERMIS);  Surgeon: Wallace Going, DO;  Location: [facility];  Service: Plastics;  Laterality: Bilateral;   LIPOSUCTION Bilateral 06/19/2021   Procedure: LIPOSUCTION;  Surgeon: Wallace Going, DO;  Location: [facility];  Service: Plastics;  Laterality: Bilateral;   MASTECTOMY W/ SENTINEL NODE BIOPSY Left 02/20/2021   Procedure: LEFT MASTECTOMY WITH SENTINEL LYMPH NODE BIOPSY;  Surgeon:  Jovita Kussmaul, MD;  Location: [facility];  Service: General;  Laterality: Left;   REMOVAL OF BILATERAL TISSUE EXPANDERS WITH PLACEMENT OF BILATERAL BREAST IMPLANTS Bilateral 06/19/2021   Procedure: bilateral removal of expanders and placement of implants;  Surgeon: Wallace Going, DO;  Location: [facility];   Service: Plastics;  Laterality: Bilateral;   TOTAL MASTECTOMY Right 02/20/2021   Procedure: RIGHT TOTAL MASTECTOMY;  Surgeon: Jovita Kussmaul, MD;  Location: [facility];  Service: General;  Laterality: Right;   TUBAL LIGATION     WISDOM TOOTH EXTRACTION     XI ROBOTIC ASSISTED OOPHORECTOMY Bilateral 12/10/2020   Procedure: XI ROBOTIC ASSISTED BILATERAL  OOPHORECTOMY,  INTRAUTERINE DEVICE (IUD) INSERTION MIRENA;  Surgeon: Lafonda Mosses, MD;  Location: [facility];  Service: Gynecology;  Laterality: Bilateral;    SOCIAL HISTORY: Social History   Socioeconomic History   Marital status: Single    Spouse name: Not on file   Number of children: Not on file   Years of education: Not on file   Highest education level: Not on file  Occupational History   Occupation: infection control  Tobacco Use   Smoking status: Former    Packs/day: 0.25    Years: 4.00    Total pack years: 1.00    Types: Cigarettes    Quit date: 2007    Years since quitting: 16.8   Smokeless tobacco: Never  Vaping Use   Vaping Use: Never used  Substance and Sexual Activity   Alcohol use: Yes    Comment: socially   Drug use: Never   Sexual activity: Yes    Birth control/protection: Surgical  Other Topics Concern   Not on file  Social History Narrative   Not on file   Social Determinants of Health   Financial Resource Strain: Not on file  Food Insecurity: Not on file  Transportation Needs: Not on file  Physical Activity: Not on file  Stress: Not on file  Social Connections: Not on file  Intimate Partner Violence: Not on file    FAMILY HISTORY: Family History  Problem Relation Age of Onset   Drug abuse Mother    Hypertension Mother    Hyperlipidemia Mother    Heart disease Father    Prostate cancer Father    Drug abuse Sister    Breast cancer Maternal Aunt    Ovarian cancer Maternal Aunt    BRCA 1/2 Maternal Aunt    Cervical cancer Sister    Drug abuse Sister     ALLERGIES:  is  allergic to hydrocodone-acetaminophen, oxycodone-acetaminophen, tape, and wound dressing adhesive.  MEDICATIONS:  Current Outpatient Medications  Medication Sig Dispense Refill   ADVAIR DISKUS 250-50 MCG/DOSE AEPB Inhale 1 puff into the lungs 2 (two) times daily.     albuterol (VENTOLIN HFA) 108 (90 Base) MCG/ACT inhaler Inhale 2 puffs into the lungs every 4 (four) hours as needed (Asthma).     Cholecalciferol 125 MCG (5000 UT) capsule Take 5,000 Units by mouth daily.     diazepam (VALIUM) 2 MG tablet Take 1 tablet (2 mg total) by mouth every 12 (twelve) hours as needed for muscle spasms. 20 tablet 0   loratadine (CLARITIN) 10 MG tablet Take 10 mg by mouth daily.     Multiple Vitamin (MULTI-VITAMIN DAILY PO) Take 1 tablet by mouth daily.     MULTIPLE VITAMINS-MINERALS ER PO Take 1 tablet by mouth daily.     Omega-3 Fatty Acids (FISH OIL  PO) Take 1 capsule by mouth daily.     Probiotic Product (PROBIOTIC DAILY PO) Take 1 tablet by mouth daily.     No current facility-administered medications for this visit.   PHYSICAL EXAMINATION: ECOG PERFORMANCE STATUS: 0 - Asymptomatic  Vitals:   06/12/22 1100  BP: 135/74  Pulse: 74  Resp: 16  Temp: 98.1 F (36.7 C)  SpO2: 99%     Filed Weights   06/12/22 1100  Weight: 203 lb 12.8 oz (92.4 kg)    Physical Exam Constitutional:      Appearance: Normal appearance.  HENT:     Head: Normocephalic and atraumatic.  Cardiovascular:     Rate and Rhythm: Normal rate and regular rhythm.     Pulses: Normal pulses.     Heart sounds: Normal heart sounds.  Pulmonary:     Effort: Pulmonary effort is normal.     Breath sounds: Normal breath sounds.  Abdominal:     General: Abdomen is flat.     Palpations: Abdomen is soft.  Musculoskeletal:        General: Normal range of motion.     Cervical back: Normal range of motion and neck supple. No rigidity.  Lymphadenopathy:     Cervical: No cervical adenopathy.  Skin:    General: Skin is warm.   Neurological:     General: No focal deficit present.     Mental Status: She is alert.  Psychiatric:        Mood and Affect: Mood normal.  Breasts: No evidence of recurrence in the skin or presence of axillary lymphadenopathy or other regional adenopathy.  She is status post bilateral mastectomy with reconstruction  LABORATORY DATA:  I have reviewed the data as listed Lab Results  Component Value Date   WBC 5.7 12/02/2020   HGB 14.6 12/02/2020   HCT 43.6 12/02/2020   MCV 96.0 12/02/2020   PLT 245 12/02/2020     Chemistry      Component Value Date/Time   NA 136 12/02/2020 0933   K 3.7 12/02/2020 0933   CL 104 12/02/2020 0933   CO2 23 12/02/2020 0933   BUN 19 12/02/2020 0933   CREATININE 0.64 12/02/2020 0933      Component Value Date/Time   CALCIUM 9.2 12/02/2020 0933       RADIOGRAPHIC STUDIES: I have personally reviewed the radiological images as listed and agreed with the findings in the report. No results found.   All questions were answered. The patient knows to call the clinic with any problems, questions or concerns. I spent 30 minutes in the care of this patient including reviewing records, history, physical exam, counseling and coordination of care   Benay Pike, MD 06/12/2022 11:07 AM

## 2022-06-19 ENCOUNTER — Encounter: Payer: Self-pay | Admitting: Radiology

## 2022-10-06 IMAGING — MR MR BREAST*R* WO/W CM
5 of 6 series · 35 of 48 positions shown · IV contrast (gadavist)
Comparison: Previous exam(s).

CLINICAL DATA: 39-year-old female with 6HYIM gene positive. Recent
screening MRI showed 2 areas of abnormal enhancement in the right
breast for which biopsy was recommended. Patient underwent 2 MR
guided core biopsies of the left breast on 01/02/2021.

LABS:  None obtained at the time of imaging.
EXAM:
MR OF THE RIGHT BREAST WITH AND WITHOUT CONTRAST
TECHNIQUE: Multiplanar, multisequence MR images of the right breast were
obtained prior to and following the intravenous administration of 10
ml of Gadavist.

[Series 2: fiducial unilateral · sagittal · 2.0mm · 1.33mm/px · 3 of 56 slices shown]
[im 1/56]
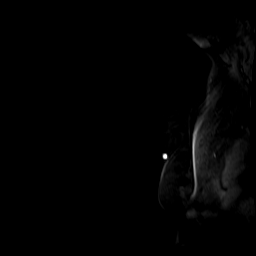
[im 28/56]
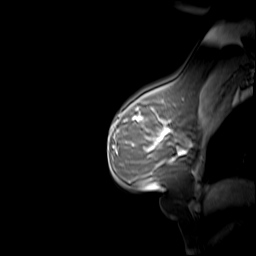
[im 56/56]
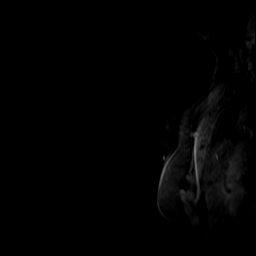

[Series 3: dynamic pre · axial · non-contrast · 1.3mm · 0.73mm/px · z∈[-123,+83]mm · 9 of 160 slices shown]
[im 1/160]
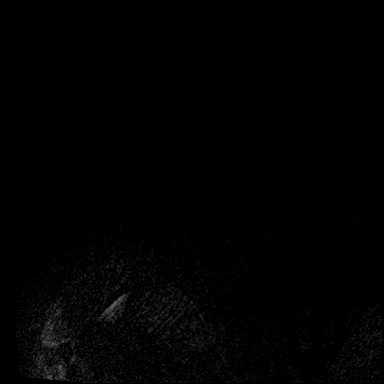
[im 20/160]
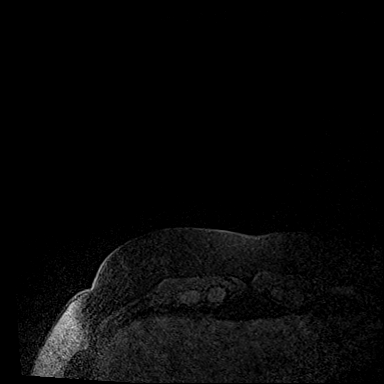
[im 40/160]
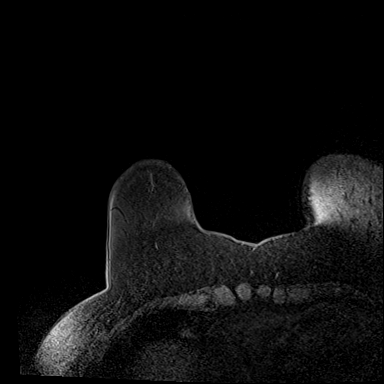
[im 60/160]
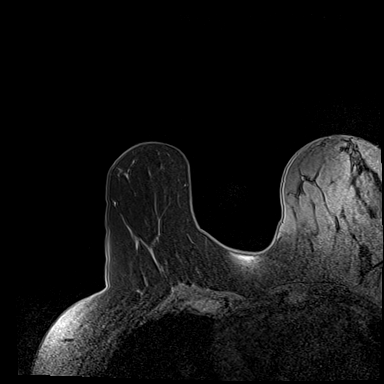
[im 80/160]
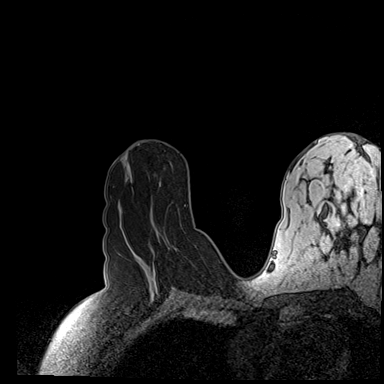
[im 100/160]
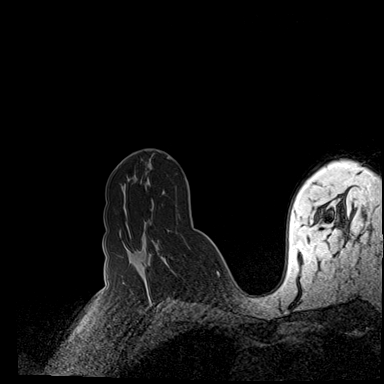
[im 120/160]
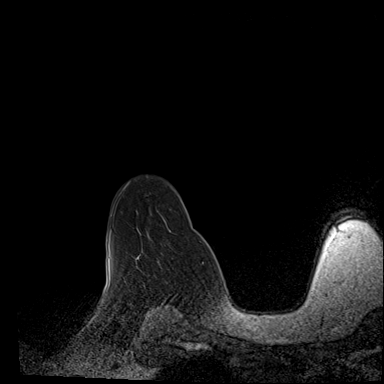
[im 140/160]
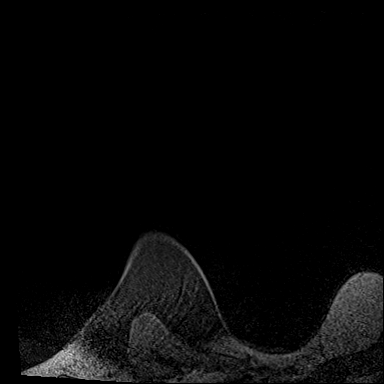
[im 160/160]
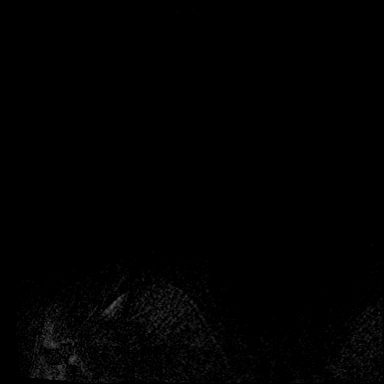

[Series 4: dynamic post 20 · axial · 1.3mm · 0.73mm/px · z∈[-123,+83]mm · 9 of 160 slices shown (1 of 2)]
[im 1/160]
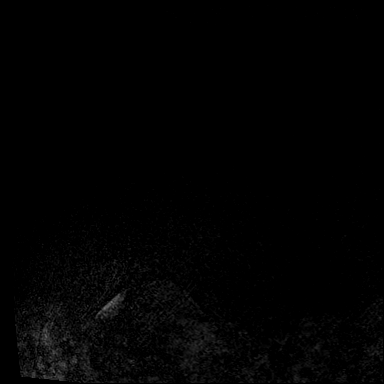
[im 20/160]
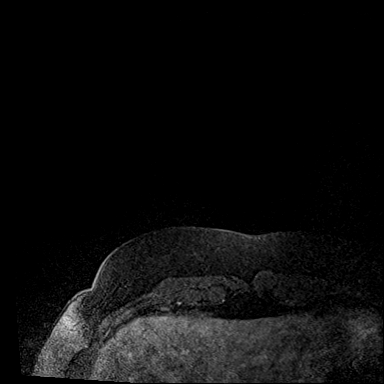
[im 40/160]
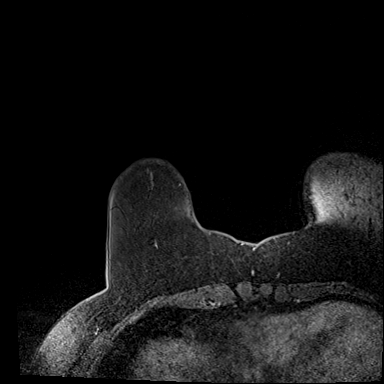
[im 60/160]
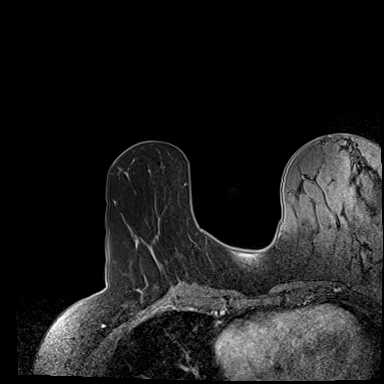
[im 80/160]
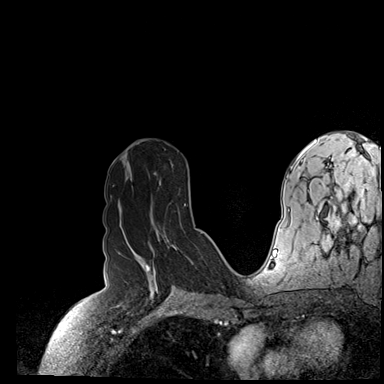
[im 100/160]
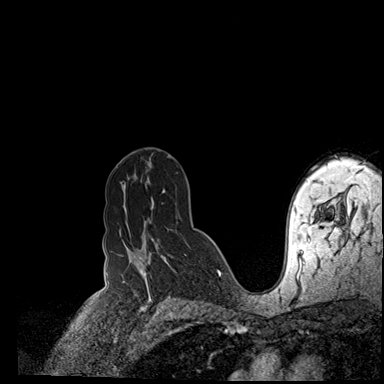
[im 120/160]
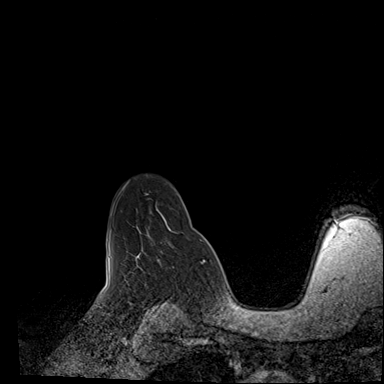
[im 140/160]
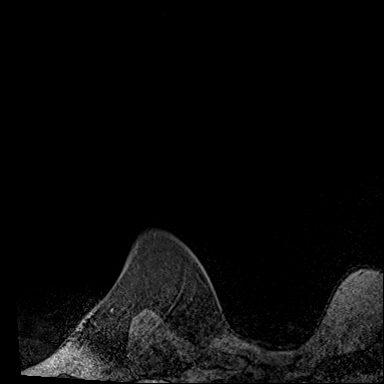
[im 160/160]
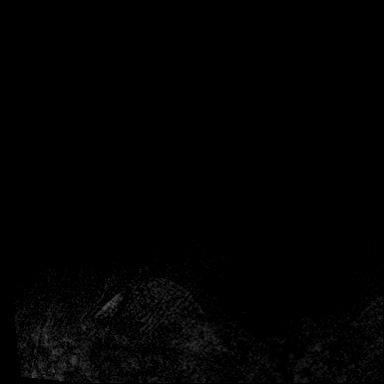

[Series 5: dynamic post 20 · axial · 1.3mm · 0.73mm/px · z∈[-123,+83]mm · 9 of 160 slices shown (2 of 2)]
[im 1/160]
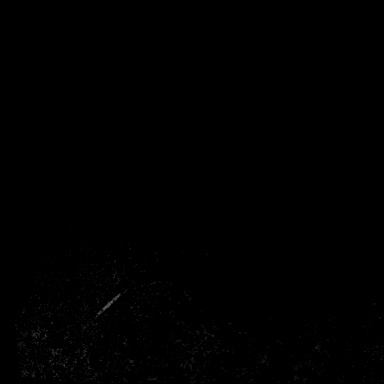
[im 20/160]
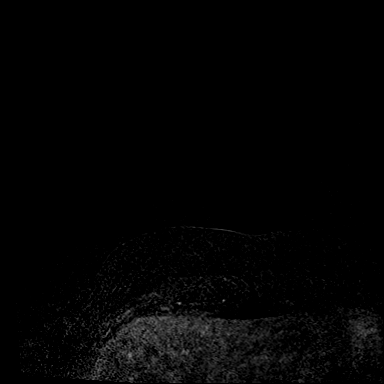
[im 40/160]
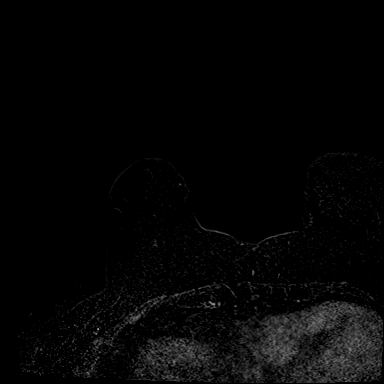
[im 60/160]
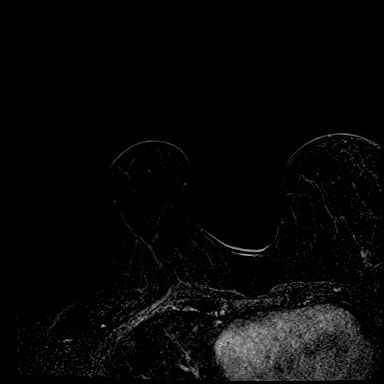
[im 80/160]
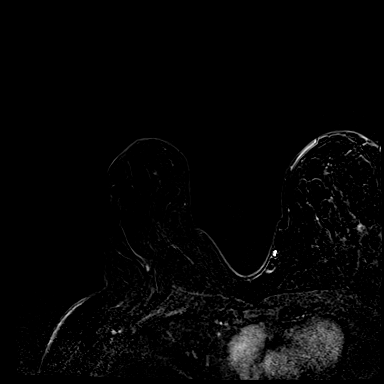
[im 100/160]
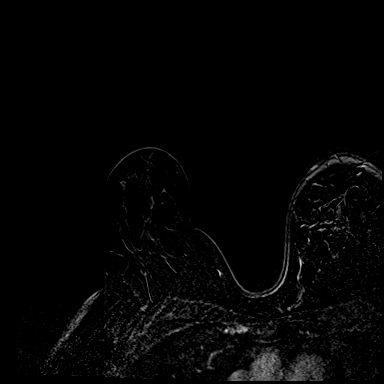
[im 120/160]
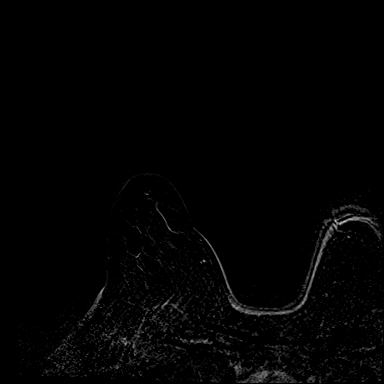
[im 140/160]
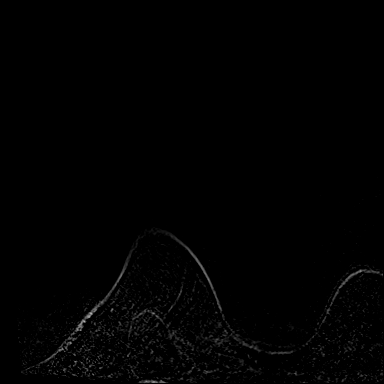
[im 160/160]
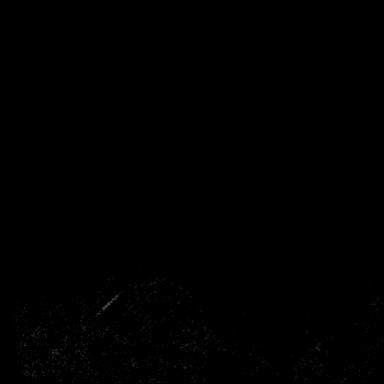

[Series 6: dynamic post 3 · axial · 1.3mm · 0.73mm/px · z∈[-123,+5]mm · 5 of 160 slices shown]
[im 1/160]
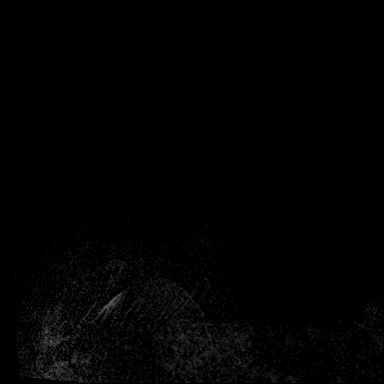
[im 20/160]
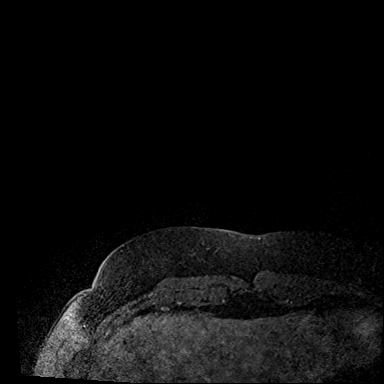
[im 40/160]
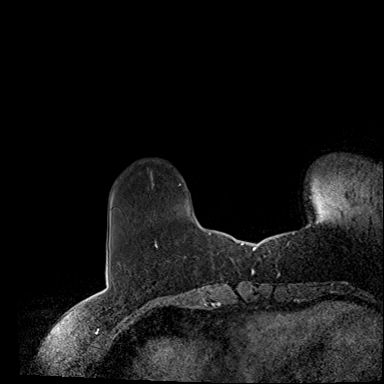
[im 60/160]
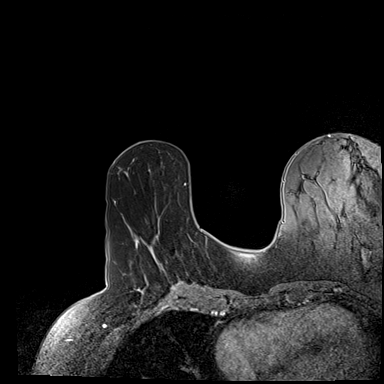
[im 100/160]
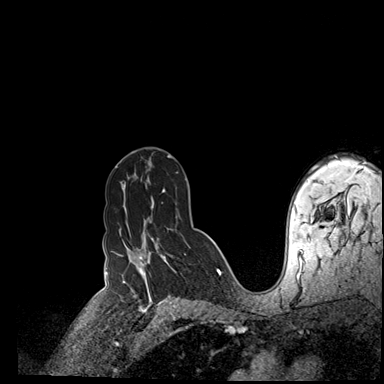

[35 of 48 positions shown; findings below may reference images not displayed]

Three-dimensional MR images were rendered by post-processing of the
original MR data on an independent workstation. The
three-dimensional MR images were interpreted, and findings are
reported in the following complete MRI report for this study. Three
dimensional images were evaluated at the independent [employer]
workstation
FINDINGS: Breast composition: b. Scattered fibroglandular tissue.

Background parenchymal enhancement: Moderate.

Patient presents for MR guided core biopsies of the right breast.
MRI identified the 2 areas of abnormal enhancement in the right
breast and 2 needles were placed into the right breast. The patient
became vasovagal and nauseated and could not tolerate the biopsies.
The needles were removed and pressure was applied to the right
breast. The patient was cared for and left the facility in good
health. The patient will be rescheduled for the right MR guided core
biopsies.
IMPRESSION: MR guided core biopsies of the right breast could not be performed
secondary to the patient becoming nauseated and vasovagal.

RECOMMENDATION:
MR guided core biopsies of the 2 areas of abnormal enhancement in
the right breast is recommended.

BI-RADS CATEGORY  4: Suspicious.

## 2022-12-18 ENCOUNTER — Other Ambulatory Visit: Payer: Self-pay

## 2022-12-18 ENCOUNTER — Inpatient Hospital Stay: Payer: BC Managed Care – PPO | Attending: Hematology and Oncology | Admitting: Hematology and Oncology

## 2022-12-18 VITALS — BP 140/87 | HR 75 | Temp 97.9°F | Resp 16 | Ht 67.0 in | Wt 203.8 lb

## 2022-12-18 DIAGNOSIS — D0512 Intraductal carcinoma in situ of left breast: Secondary | ICD-10-CM | POA: Insufficient documentation

## 2022-12-18 DIAGNOSIS — Z1509 Genetic susceptibility to other malignant neoplasm: Secondary | ICD-10-CM

## 2022-12-18 DIAGNOSIS — Z803 Family history of malignant neoplasm of breast: Secondary | ICD-10-CM | POA: Diagnosis not present

## 2022-12-18 DIAGNOSIS — Z1501 Genetic susceptibility to malignant neoplasm of breast: Secondary | ICD-10-CM | POA: Diagnosis not present

## 2022-12-18 DIAGNOSIS — Z8041 Family history of malignant neoplasm of ovary: Secondary | ICD-10-CM | POA: Diagnosis not present

## 2022-12-18 DIAGNOSIS — Z87891 Personal history of nicotine dependence: Secondary | ICD-10-CM | POA: Diagnosis not present

## 2022-12-18 DIAGNOSIS — Z1502 Genetic susceptibility to malignant neoplasm of ovary: Secondary | ICD-10-CM | POA: Insufficient documentation

## 2022-12-18 DIAGNOSIS — Z8042 Family history of malignant neoplasm of prostate: Secondary | ICD-10-CM | POA: Diagnosis not present

## 2022-12-18 NOTE — Progress Notes (Signed)
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   Rest of the breast biopsies did not show any evidence of malignancy.   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    She had salpingo-oophorectomy given her BRCA2 mutation.  Since last visit, she also had bilateral mastectomy in June 2022. No role for antiestrogen therapy since she had bilateral mastectomy.  She continues on estrogen supplementation takes 1 mg once a day, she feels so much better.  We have on several occasions discussed that estrogen supplementation is contraindicated in BRCA2 mutation and breast cancer and this may increase her risk of breast cancer recurrence.  She clearly understands all her risks and still chooses to continue with because of her quality of life after bilateral salpingo-oophorectomy.    She continues to do well, intermittent axillary pain which is quite sporadic. She describes this as a nerve pain.  No other concerns today.  No concerns on physical exam, bilateral breasts inspected and palpated without any evidence of local recurrence or regional adenopathy.  No symptoms or signs concerning for pancreatic cancer today.   No role for screening given lack of family history of pancreatic cancer. Recommend annual dermatology visit.  She is up-to-date with a dermatology visit.  Return to clinic in 6 months or sooner as  needed.  HISTORY OF PRESENTING ILLNESS:  Ebony Watson 42 y.o. 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   INTERIM HISTORY  [name] is here for follow-up after her bilateral mastectomies.  She continues to take 1 mg estrogen. She feels much better on it. She denies any health issues. Intermittent right axillary sharp pains/nerve pain but not on a regular basis.  She denies any breast changes.  She states that she is not satisfied with the plastic surgeon and I would like to see a different one.  She has met Dr. Leta Baptist who she liked but she is not ready to proceed with surgery. Rest of the pertinent 10 point ROS reviewed and negative  MEDICAL HISTORY:  Past Medical History:  Diagnosis Date   Anxiety    Asthma    BRCA2 gene mutation positive in female    Depression    Hyperlipidemia     SURGICAL HISTORY: Past Surgical History:  Procedure Laterality Date   BREAST RECONSTRUCTION WITH PLACEMENT OF TISSUE EXPANDER AND FLEX HD (ACELLULAR HYDRATED DERMIS) Bilateral 02/20/2021   Procedure: BILATERAL IMMEDIATE BREAST RECONSTRUCTION WITH PLACEMENT OF TISSUE EXPANDER AND FLEX HD (ACELLULAR HYDRATED DERMIS);  Surgeon: Peggye Form, DO;  Location: [facility];  Service: Plastics;  Laterality: Bilateral;   LIPOSUCTION Bilateral 06/19/2021   Procedure: LIPOSUCTION;  Surgeon: Peggye Form, DO;  Location: [facility];  Service: Plastics;  Laterality: Bilateral;   MASTECTOMY W/ SENTINEL NODE BIOPSY Left 02/20/2021   Procedure: LEFT MASTECTOMY WITH SENTINEL LYMPH  NODE BIOPSY;  Surgeon: Griselda Miner, MD;  Location: [facility];  Service: General;  Laterality: Left;   REMOVAL OF BILATERAL TISSUE EXPANDERS WITH PLACEMENT OF BILATERAL BREAST IMPLANTS Bilateral 06/19/2021   Procedure: bilateral removal of expanders and placement of implants;  Surgeon: Peggye Form, DO;   Location: [facility];  Service: Plastics;  Laterality: Bilateral;   TOTAL MASTECTOMY Right 02/20/2021   Procedure: RIGHT TOTAL MASTECTOMY;  Surgeon: Griselda Miner, MD;  Location: [facility];  Service: General;  Laterality: Right;   TUBAL LIGATION     WISDOM TOOTH EXTRACTION     XI ROBOTIC ASSISTED OOPHORECTOMY Bilateral 12/10/2020   Procedure: XI ROBOTIC ASSISTED BILATERAL  OOPHORECTOMY,  INTRAUTERINE DEVICE (IUD) INSERTION MIRENA;  Surgeon: Carver Fila, MD;  Location: [facility];  Service: Gynecology;  Laterality: Bilateral;    SOCIAL HISTORY: Social History   Socioeconomic History   Marital status: Single    Spouse name: Not on file   Number of children: Not on file   Years of education: Not on file   Highest education level: Not on file  Occupational History   Occupation: infection control  Tobacco Use   Smoking status: Former    Packs/day: 0.25    Years: 4.00    Additional pack years: 0.00    Total pack years: 1.00    Types: Cigarettes    Quit date: 2007    Years since quitting: 17.3   Smokeless tobacco: Never  Vaping Use   Vaping Use: Never used  Substance and Sexual Activity   Alcohol use: Yes    Comment: socially   Drug use: Never   Sexual activity: Yes    Birth control/protection: Surgical  Other Topics Concern   Not on file  Social History Narrative   Not on file   Social Determinants of Health   Financial Resource Strain: Not on file  Food Insecurity: Not on file  Transportation Needs: Not on file  Physical Activity: Not on file  Stress: Not on file  Social Connections: Not on file  Intimate Partner Violence: Not on file    FAMILY HISTORY: Family History  Problem Relation Age of Onset   Drug abuse Mother    Hypertension Mother    Hyperlipidemia Mother    Heart disease Father    Prostate cancer Father    Drug abuse Sister    Breast cancer Maternal Aunt    Ovarian cancer Maternal Aunt    BRCA 1/2 Maternal Aunt     Cervical cancer Sister    Drug abuse Sister     ALLERGIES:  is allergic to hydrocodone-acetaminophen, oxycodone-acetaminophen, tape, and wound dressing adhesive.  MEDICATIONS:  Current Outpatient Medications  Medication Sig Dispense Refill   ADVAIR DISKUS 250-50 MCG/DOSE AEPB Inhale 1 puff into the lungs 2 (two) times daily.     albuterol (VENTOLIN HFA) 108 (90 Base) MCG/ACT inhaler Inhale 2 puffs into the lungs every 4 (four) hours as needed (Asthma).     Cholecalciferol 125 MCG (5000 UT) capsule Take 5,000 Units by mouth daily.     diazepam (VALIUM) 2 MG tablet Take 1 tablet (2 mg total) by mouth every 12 (twelve) hours as needed for muscle spasms. 20 tablet 0   loratadine (CLARITIN) 10 MG tablet Take 10 mg by mouth daily.     Multiple Vitamin (MULTI-VITAMIN DAILY PO) Take 1 tablet by mouth daily.     MULTIPLE VITAMINS-MINERALS ER PO Take 1 tablet by  mouth daily.     Omega-3 Fatty Acids (FISH OIL PO) Take 1 capsule by mouth daily.     Probiotic Product (PROBIOTIC DAILY PO) Take 1 tablet by mouth daily.     No current facility-administered medications for this visit.   PHYSICAL EXAMINATION: ECOG PERFORMANCE STATUS: 0 - Asymptomatic  There were no vitals filed for this visit.    There were no vitals filed for this visit.   Physical Exam Constitutional:      Appearance: Normal appearance.  HENT:     Head: Normocephalic and atraumatic.  Cardiovascular:     Rate and Rhythm: Normal rate and regular rhythm.     Pulses: Normal pulses.     Heart sounds: Normal heart sounds.  Pulmonary:     Effort: Pulmonary effort is normal.     Breath sounds: Normal breath sounds.  Abdominal:     General: Abdomen is flat.     Palpations: Abdomen is soft.  Musculoskeletal:        General: Normal range of motion.     Cervical back: Normal range of motion and neck supple. No rigidity.  Lymphadenopathy:     Cervical: No cervical adenopathy.  Skin:    General: Skin is warm.  Neurological:      General: No focal deficit present.     Mental Status: She is alert.  Psychiatric:        Mood and Affect: Mood normal.   Breasts: No evidence of recurrence in the skin or presence of axillary lymphadenopathy or other regional adenopathy.  She is status post bilateral mastectomy with reconstruction. No change  LABORATORY DATA:  I have reviewed the data as listed Lab Results  Component Value Date   WBC 5.7 12/02/2020   HGB 14.6 12/02/2020   HCT 43.6 12/02/2020   MCV 96.0 12/02/2020   PLT 245 12/02/2020     Chemistry      Component Value Date/Time   NA 136 12/02/2020 0933   K 3.7 12/02/2020 0933   CL 104 12/02/2020 0933   CO2 23 12/02/2020 0933   BUN 19 12/02/2020 0933   CREATININE 0.64 12/02/2020 0933      Component Value Date/Time   CALCIUM 9.2 12/02/2020 0933       RADIOGRAPHIC STUDIES: I have personally reviewed the radiological images as listed and agreed with the findings in the report. No results found.   All questions were answered. The patient knows to call the clinic with any problems, questions or concerns. I spent 30 minutes in the care of this patient including reviewing records, history, physical exam, counseling and coordination of care   Rachel Moulds, MD 12/18/2022 8:45 AM

## 2023-06-18 ENCOUNTER — Encounter: Payer: Self-pay | Admitting: Hematology and Oncology

## 2023-06-18 ENCOUNTER — Inpatient Hospital Stay: Payer: BC Managed Care – PPO | Attending: Hematology and Oncology | Admitting: Hematology and Oncology

## 2023-06-18 ENCOUNTER — Other Ambulatory Visit: Payer: Self-pay

## 2023-06-18 ENCOUNTER — Other Ambulatory Visit: Payer: Self-pay | Admitting: *Deleted

## 2023-06-18 VITALS — BP 139/76 | HR 71 | Temp 97.2°F | Resp 18 | Ht 67.0 in | Wt 190.2 lb

## 2023-06-18 DIAGNOSIS — Z8042 Family history of malignant neoplasm of prostate: Secondary | ICD-10-CM | POA: Insufficient documentation

## 2023-06-18 DIAGNOSIS — Z803 Family history of malignant neoplasm of breast: Secondary | ICD-10-CM | POA: Diagnosis not present

## 2023-06-18 DIAGNOSIS — N951 Menopausal and female climacteric states: Secondary | ICD-10-CM

## 2023-06-18 DIAGNOSIS — Z1509 Genetic susceptibility to other malignant neoplasm: Secondary | ICD-10-CM

## 2023-06-18 DIAGNOSIS — Z1502 Genetic susceptibility to malignant neoplasm of ovary: Secondary | ICD-10-CM | POA: Diagnosis not present

## 2023-06-18 DIAGNOSIS — T8544XD Capsular contracture of breast implant, subsequent encounter: Secondary | ICD-10-CM

## 2023-06-18 DIAGNOSIS — Z87891 Personal history of nicotine dependence: Secondary | ICD-10-CM | POA: Insufficient documentation

## 2023-06-18 DIAGNOSIS — D0512 Intraductal carcinoma in situ of left breast: Secondary | ICD-10-CM | POA: Insufficient documentation

## 2023-06-18 DIAGNOSIS — Z1501 Genetic susceptibility to malignant neoplasm of breast: Secondary | ICD-10-CM

## 2023-06-18 DIAGNOSIS — Z1721 Progesterone receptor positive status: Secondary | ICD-10-CM | POA: Insufficient documentation

## 2023-06-18 DIAGNOSIS — C50512 Malignant neoplasm of lower-outer quadrant of left female breast: Secondary | ICD-10-CM

## 2023-06-18 DIAGNOSIS — Z170 Estrogen receptor positive status [ER+]: Secondary | ICD-10-CM | POA: Insufficient documentation

## 2023-06-18 DIAGNOSIS — Z8041 Family history of malignant neoplasm of ovary: Secondary | ICD-10-CM | POA: Insufficient documentation

## 2023-06-18 DIAGNOSIS — Z9013 Acquired absence of bilateral breasts and nipples: Secondary | ICD-10-CM

## 2023-06-18 NOTE — Progress Notes (Signed)
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   Rest of the breast biopsies did not show any evidence of malignancy.   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    She had a very tough time after bilateral salpingo-oophorectomy and ended up going on estrogen supplementation which she obtains from [place].  She clearly understands the increased risk of breast cancer recurrence while on estrogen supplementation given her history of breast cancer and BRCA2 mutation.  There is no change on physical exam with regards to her implants today.  However I believe we may have to have some form of imaging likely an MRI in this case to 1 know the integrity of implants as well as the status of breast cancer.  She is agreeable to any surveillance imaging.  She will keep [place] posted when she is ready for the TRAM flap surgery and we can make appropriate referrals.  Her most recent labs from September done at her PCPs office with no significant findings on CBC and CMP.  I will see her back in 6 months or sooner as needed.  Return to clinic in 6 months or sooner as needed.  HISTORY OF PRESENTING ILLNESS:  Ebony Watson 42 y.o. 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   INTERIM HISTORY  [name] is here for follow-up after her bilateral mastectomies. Since her  last visit here, she has been doing quite well.  She had lost about 15 pounds of weight intentionally.  She has followed up with Dr. Leta Baptist from plastic surgery, recommendation was to revise the implants versus consider TRAM flap for plastic surgery.  She says she is not quite ready to be a patient again hence would like to continue follow-up for the time being and may be consider TRAM flap at a later date.  She denies any changes in the breast implants.  She denies any palpable masses in the chest wall.  No change in gut habits.  No new family history of pancreatic cancer.  She is going to get her dermatology visit next month.  She has not noticed any abnormal moles.  Rest of the pertinent 10 point ROS reviewed and negative   MEDICAL HISTORY:  Past Medical History:  Diagnosis Date   Anxiety    Asthma    BRCA2 gene mutation positive in female    Depression    Hyperlipidemia     SURGICAL HISTORY: Past Surgical History:  Procedure Laterality Date   BREAST RECONSTRUCTION WITH PLACEMENT OF TISSUE EXPANDER AND FLEX HD (ACELLULAR HYDRATED DERMIS) Bilateral 02/20/2021   Procedure: BILATERAL IMMEDIATE BREAST RECONSTRUCTION WITH PLACEMENT OF TISSUE EXPANDER AND FLEX HD (ACELLULAR HYDRATED DERMIS);  Surgeon: Peggye Form, DO;  Location: [facility];  Service: Plastics;  Laterality: Bilateral;   LIPOSUCTION Bilateral 06/19/2021   Procedure: LIPOSUCTION;  Surgeon: Ulice Bold,  Alena Bills, DO;  Location: [facility];  Service: Plastics;  Laterality: Bilateral;   MASTECTOMY W/ SENTINEL NODE BIOPSY Left 02/20/2021   Procedure: LEFT MASTECTOMY WITH SENTINEL LYMPH NODE BIOPSY;  Surgeon: Griselda Miner, MD;  Location: [facility];  Service: General;  Laterality: Left;   REMOVAL OF BILATERAL TISSUE EXPANDERS WITH PLACEMENT OF BILATERAL BREAST IMPLANTS Bilateral 06/19/2021   Procedure: bilateral removal of expanders and placement of implants;  Surgeon: Peggye Form, DO;  Location: [facility];  Service: Plastics;  Laterality: Bilateral;   TOTAL MASTECTOMY Right 02/20/2021   Procedure: RIGHT TOTAL MASTECTOMY;  Surgeon: Griselda Miner, MD;  Location: [facility];  Service: General;  Laterality: Right;   TUBAL LIGATION     WISDOM TOOTH EXTRACTION     XI ROBOTIC ASSISTED OOPHORECTOMY Bilateral 12/10/2020   Procedure: XI ROBOTIC ASSISTED BILATERAL  OOPHORECTOMY,  INTRAUTERINE DEVICE (IUD) INSERTION MIRENA;  Surgeon: Carver Fila, MD;  Location: [facility];  Service: Gynecology;  Laterality: Bilateral;    SOCIAL HISTORY: Social History   Socioeconomic History   Marital status: Single    Spouse name: Not on file   Number of children: Not on file   Years of education: Not on file   Highest education level: Not on file  Occupational History   Occupation: infection control  Tobacco Use   Smoking status: Former    Current packs/day: 0.00    Average packs/day: 0.3 packs/day for 4.0 years (1.0 ttl pk-yrs)    Types: Cigarettes    Start date: 2003    Quit date: 2007    Years since quitting: 17.8   Smokeless tobacco: Never  Vaping Use   Vaping status: Never Used  Substance and Sexual Activity   Alcohol use: Yes    Comment: socially   Drug use: Never   Sexual activity: Yes    Birth control/protection: Surgical  Other Topics Concern   Not on file  Social History Narrative   Not on file   Social Determinants of Health   Financial Resource Strain: Low Risk  (05/14/2023)   Received from [facility]   Overall Financial Resource Strain (CARDIA)    Difficulty of Paying Living Expenses: Not hard at all  Food Insecurity: No Food Insecurity (05/14/2023)   Received from [facility]   Hunger Vital Sign    Worried About Running Out of Food in the Last Year: Never true    Ran Out of Food in the Last Year: Never true  Transportation Needs: No Transportation Needs (05/14/2023)   Received from [facility]  - Transportation    Lack of Transportation (Medical): No    Lack of Transportation (Non-Medical): No  Physical Activity: Sufficiently Active (05/14/2023)   Received from [facility]   Exercise Vital Sign    Days of Exercise per Week: 3 days    Minutes of Exercise per Session: 60 min  Stress: Stress Concern Present (05/14/2023)   Received from [facility] - Occupational Stress Questionnaire    Feeling of Stress : To some extent  Social Connections: Socially Integrated (05/14/2023)   Received from [facility]   Social Network    How would you rate your social network (family, work, friends)?: Good participation with social networks  Intimate Partner Violence: Not At Risk (05/14/2023)   Received from [facility]   HITS    Over the last 12 months how  often did your partner physically hurt you?: 1    Over the last 12 months how often did your partner insult you or talk down to you?: 1    Over the last 12 months how often did your partner threaten you with physical harm?: 1    Over the last 12 months how often did your partner scream or curse at you?: 1    FAMILY HISTORY: Family History  Problem Relation Age of Onset   Drug abuse Mother    Hypertension Mother    Hyperlipidemia Mother    Heart disease Father    Prostate cancer Father    Drug abuse Sister    Breast cancer Maternal Aunt    Ovarian cancer Maternal Aunt    BRCA 1/2 Maternal Aunt    Cervical cancer Sister    Drug abuse Sister     ALLERGIES:  is allergic to hydrocodone-acetaminophen, oxycodone-acetaminophen, tape, and wound dressing adhesive.  MEDICATIONS:  Current Outpatient Medications  Medication Sig Dispense Refill   ADVAIR DISKUS 250-50 MCG/DOSE AEPB Inhale 1 puff into the lungs 2 (two) times daily.     albuterol (VENTOLIN HFA) 108 (90 Base) MCG/ACT inhaler Inhale 2 puffs into the lungs every 4 (four) hours as needed (Asthma).     Cholecalciferol 125 MCG (5000 UT)  capsule Take 5,000 Units by mouth daily.     diazepam (VALIUM) 2 MG tablet Take 1 tablet (2 mg total) by mouth every 12 (twelve) hours as needed for muscle spasms. 20 tablet 0   loratadine (CLARITIN) 10 MG tablet Take 10 mg by mouth daily.     Multiple Vitamin (MULTI-VITAMIN DAILY PO) Take 1 tablet by mouth daily.     MULTIPLE VITAMINS-MINERALS ER PO Take 1 tablet by mouth daily.     Omega-3 Fatty Acids (FISH OIL PO) Take 1 capsule by mouth daily.     Probiotic Product (PROBIOTIC DAILY PO) Take 1 tablet by mouth daily.     No current facility-administered medications for this visit.   PHYSICAL EXAMINATION: ECOG PERFORMANCE STATUS: 0 - Asymptomatic  Vitals:   06/18/23 0852  BP: 139/76  Pulse: 71  Resp: 18  Temp: (!) 97.2 F (36.2 C)  SpO2: 99%     Filed Weights   06/18/23 0852  Weight: 190 lb 3.2 oz (86.3 kg)    Physical Exam Constitutional:      Appearance: Normal appearance.  HENT:     Head: Normocephalic and atraumatic.  Cardiovascular:     Rate and Rhythm: Normal rate and regular rhythm.     Pulses: Normal pulses.     Heart sounds: Normal heart sounds.  Pulmonary:     Effort: Pulmonary effort is normal.     Breath sounds: Normal breath sounds.  Abdominal:     General: Abdomen is flat.     Palpations: Abdomen is soft.  Musculoskeletal:        General: Normal range of motion.     Cervical back: Normal range of motion and neck supple. No rigidity.  Lymphadenopathy:     Cervical: No cervical adenopathy.  Skin:    General: Skin is warm.  Neurological:     General: No focal deficit present.     Mental Status: She is alert.  Psychiatric:        Mood and Affect: Mood normal.   Breasts: No evidence of recurrence in the skin or presence of axillary lymphadenopathy or other regional adenopathy.  She is status post bilateral mastectomy with reconstruction.  No change  LABORATORY DATA:  I have reviewed the data as listed Lab Results  Component Value Date   WBC 5.7  12/02/2020   HGB 14.6 12/02/2020   HCT 43.6 12/02/2020   MCV 96.0 12/02/2020   PLT 245 12/02/2020     Chemistry      Component Value Date/Time   NA 136 12/02/2020 0933   K 3.7 12/02/2020 0933   CL 104 12/02/2020 0933   CO2 23 12/02/2020 0933   BUN 19 12/02/2020 0933   CREATININE 0.64 12/02/2020 0933      Component Value Date/Time   CALCIUM 9.2 12/02/2020 0933       RADIOGRAPHIC STUDIES: I have personally reviewed the radiological images as listed and agreed with the findings in the report. No results found.   All questions were answered. The patient knows to call the clinic with any problems, questions or concerns. I spent 30 minutes in the care of this patient including reviewing records, history, physical exam, counseling and coordination of care   Rachel Moulds, MD 06/18/2023 8:54 AM

## 2023-06-23 ENCOUNTER — Encounter: Payer: Self-pay | Admitting: Hematology and Oncology

## 2023-06-24 ENCOUNTER — Other Ambulatory Visit: Payer: Self-pay | Admitting: *Deleted

## 2023-06-24 DIAGNOSIS — Z1501 Genetic susceptibility to malignant neoplasm of breast: Secondary | ICD-10-CM

## 2023-06-24 DIAGNOSIS — T8544XD Capsular contracture of breast implant, subsequent encounter: Secondary | ICD-10-CM

## 2023-06-24 DIAGNOSIS — Z9013 Acquired absence of bilateral breasts and nipples: Secondary | ICD-10-CM

## 2023-06-24 DIAGNOSIS — Z170 Estrogen receptor positive status [ER+]: Secondary | ICD-10-CM

## 2023-08-14 ENCOUNTER — Inpatient Hospital Stay: Admission: RE | Admit: 2023-08-14 | Payer: BC Managed Care – PPO | Source: Ambulatory Visit

## 2023-08-16 ENCOUNTER — Ambulatory Visit
Admission: RE | Admit: 2023-08-16 | Discharge: 2023-08-16 | Disposition: A | Discharge status: Home / Self Care | Payer: BC Managed Care – PPO | Source: Ambulatory Visit | Attending: Hematology and Oncology

## 2023-08-16 DIAGNOSIS — T8544XD Capsular contracture of breast implant, subsequent encounter: Secondary | ICD-10-CM

## 2023-08-16 DIAGNOSIS — Z1501 Genetic susceptibility to malignant neoplasm of breast: Secondary | ICD-10-CM

## 2023-08-16 DIAGNOSIS — Z9013 Acquired absence of bilateral breasts and nipples: Secondary | ICD-10-CM

## 2023-08-16 DIAGNOSIS — Z170 Estrogen receptor positive status [ER+]: Secondary | ICD-10-CM

## 2023-08-16 MED ORDER — GADOPICLENOL 0.5 MMOL/ML IV SOLN
9.0000 mL | Freq: Once | INTRAVENOUS | Status: AC | PRN
  Administered 2023-08-16: 9 mL via INTRAVENOUS

## 2023-08-27 ENCOUNTER — Encounter: Payer: BC Managed Care – PPO | Admitting: Obstetrics and Gynecology

## 2023-10-12 ENCOUNTER — Other Ambulatory Visit (HOSPITAL_COMMUNITY)
Admission: RE | Admit: 2023-10-12 | Discharge: 2023-10-12 | Disposition: A | Discharge status: Home / Self Care | Payer: BC Managed Care – PPO | Source: Ambulatory Visit | Attending: Radiology | Admitting: Radiology

## 2023-10-12 ENCOUNTER — Encounter: Payer: Self-pay | Admitting: Radiology

## 2023-10-12 ENCOUNTER — Ambulatory Visit (INDEPENDENT_AMBULATORY_CARE_PROVIDER_SITE_OTHER): Payer: BC Managed Care – PPO | Admitting: Radiology

## 2023-10-12 VITALS — BP 122/80 | HR 74 | Ht 66.25 in | Wt 193.4 lb

## 2023-10-12 DIAGNOSIS — Z01419 Encounter for gynecological examination (general) (routine) without abnormal findings: Secondary | ICD-10-CM | POA: Insufficient documentation

## 2023-10-12 DIAGNOSIS — C50919 Malignant neoplasm of unspecified site of unspecified female breast: Secondary | ICD-10-CM

## 2023-10-12 DIAGNOSIS — Z1501 Genetic susceptibility to malignant neoplasm of breast: Secondary | ICD-10-CM

## 2023-10-12 DIAGNOSIS — N814 Uterovaginal prolapse, unspecified: Secondary | ICD-10-CM

## 2023-10-12 DIAGNOSIS — N952 Postmenopausal atrophic vaginitis: Secondary | ICD-10-CM | POA: Diagnosis not present

## 2023-10-12 DIAGNOSIS — Z1502 Genetic susceptibility to malignant neoplasm of ovary: Secondary | ICD-10-CM

## 2023-10-12 DIAGNOSIS — N813 Complete uterovaginal prolapse: Secondary | ICD-10-CM

## 2023-10-12 MED ORDER — ESTRADIOL 0.1 MG/GM VA CREA: 1.0000 g | TOPICAL_CREAM | VAGINAL | 3 refills | Status: DC

## 2023-10-12 NOTE — Progress Notes (Signed)
 Ebony Watson Dec 25, 1980 [id number]   History:  43 y.o. G5P3 presents for annual exam as a new patient.  BRCA2 gene positive Hx breast cancer 02/28/21 bilateral mastectomy 12/10/20 bilateral salpingectomy with oophorectomy and Mirena insertion  P 05/28/20 normal, HPV negative M Bilateral breast MRI 07/2023 normal  Followed by gyn onc. On oral estrogen. Has stopped vaginal estrogen, needs refill. C/o prolapse for about 6 months.   Gynecologic History Patient's last menstrual period was 11/29/2020 (approximate).   Contraception/Family planning:  BSO Sexually active: yes   Obstetric History OB History  Gravida Para Term Preterm AB Living  5 3    3   SAB IAB Ectopic Multiple Live Births      3    # Outcome Date GA Lbr Len/2nd Weight Sex Type Anes PTL Lv  5 Gravida           4 Gravida           3 Para           2 Para           1 Para             The following portions of the patient's history were reviewed and updated as appropriate: allergies, current medications, past family history, past medical history, past social history, past surgical history, and problem list.  Review of Systems  All other systems reviewed and are negative.   Past medical history, past surgical history, family history and social history were all reviewed and documented in the EPIC chart.  Exam:  Vitals:   10/12/23 1052  BP: 122/80  Pulse: 74  SpO2: 96%  Weight: 193 lb 6.4 oz (87.7 kg)  Height: 5' 6.25" (1.683 m)   Body mass index is 30.98 kg/m.  Physical Exam Vitals and nursing note reviewed. Exam conducted with a chaperone present.  Constitutional:      Appearance: Normal appearance. She is normal weight.  HENT:     Head: Normocephalic and atraumatic.  Neck:     Thyroid: No thyroid mass, thyromegaly or thyroid tenderness.  Cardiovascular:     Rate and Rhythm: Regular rhythm.     Heart sounds: Normal heart sounds.  Pulmonary:     Effort: Pulmonary effort is normal.      Breath sounds: Normal breath sounds.  Chest:     Comments: Bilateral mastectomy with implants under the muscle. No skin changes, masses or areas of concern. Abdominal:     General: Abdomen is flat. Bowel sounds are normal.     Palpations: Abdomen is soft.  Genitourinary:    General: Normal vulva.     Vagina: Normal. No vaginal discharge, bleeding or lesions.     Cervix: Normal. No discharge or lesion.     Uterus: With uterine prolapse. Not enlarged and not tender.      Adnexa: Right adnexa normal and left adnexa normal.       Right: No mass, tenderness or fullness.         Left: No mass, tenderness or fullness.       Comments: Ovaries absent bilaterally, no masses palpated on exam in their absence.  IUD strings seen on exam.  + grade 2 prolapse, grade 3 with valsalva Lymphadenopathy:     Upper Body:     Right upper body: No axillary adenopathy.     Left upper body: No axillary adenopathy.  Skin:    General: Skin is warm and dry.  Neurological:  Mental Status: She is alert and oriented to person, place, and time.  Psychiatric:        Mood and Affect: Mood normal.        Thought Content: Thought content normal.        Judgment: Judgment normal.      Raynelle Fanning, CMA present for exam  Assessment/Plan:   1. Well woman exam with routine gynecological exam (Primary) - Cytology - PAP( [facility])  2. Uterine prolapse - Ambulatory referral to Urogynecology  3. Vaginal atrophy - estradiol (ESTRACE VAGINAL) 0.1 MG/GM vaginal cream; Place 1 g vaginally 3 (three) times a week.  Dispense: 42.5 g; Refill: 3   4. BRCA2 gene mutation positive in female Continue recommended follow up with gyn onc. Most recent MRI 07/2023    Tanda Rockers WHNP-BC 11:31 AM 10/12/2023

## 2023-10-14 LAB — CYTOLOGY - PAP
Comment: NEGATIVE
Diagnosis: NEGATIVE
High risk HPV: NEGATIVE

## 2023-10-27 ENCOUNTER — Encounter: Payer: Self-pay | Admitting: Hematology and Oncology

## 2023-12-20 ENCOUNTER — Encounter: Payer: Self-pay | Admitting: Hematology and Oncology

## 2023-12-20 ENCOUNTER — Inpatient Hospital Stay: Payer: BC Managed Care – PPO | Attending: Hematology and Oncology | Admitting: Hematology and Oncology

## 2023-12-20 VITALS — BP 129/76 | HR 77 | Temp 98.5°F | Resp 16 | Wt 195.9 lb

## 2023-12-20 DIAGNOSIS — Z170 Estrogen receptor positive status [ER+]: Secondary | ICD-10-CM | POA: Insufficient documentation

## 2023-12-20 DIAGNOSIS — Z1721 Progesterone receptor positive status: Secondary | ICD-10-CM | POA: Insufficient documentation

## 2023-12-20 DIAGNOSIS — Z8049 Family history of malignant neoplasm of other genital organs: Secondary | ICD-10-CM | POA: Insufficient documentation

## 2023-12-20 DIAGNOSIS — Z90722 Acquired absence of ovaries, bilateral: Secondary | ICD-10-CM | POA: Insufficient documentation

## 2023-12-20 DIAGNOSIS — Z8042 Family history of malignant neoplasm of prostate: Secondary | ICD-10-CM | POA: Diagnosis not present

## 2023-12-20 DIAGNOSIS — Z1501 Genetic susceptibility to malignant neoplasm of breast: Secondary | ICD-10-CM | POA: Insufficient documentation

## 2023-12-20 DIAGNOSIS — Z1502 Genetic susceptibility to malignant neoplasm of ovary: Secondary | ICD-10-CM | POA: Insufficient documentation

## 2023-12-20 DIAGNOSIS — K5900 Constipation, unspecified: Secondary | ICD-10-CM | POA: Insufficient documentation

## 2023-12-20 DIAGNOSIS — Z1509 Genetic susceptibility to other malignant neoplasm: Secondary | ICD-10-CM

## 2023-12-20 DIAGNOSIS — Z8041 Family history of malignant neoplasm of ovary: Secondary | ICD-10-CM | POA: Diagnosis not present

## 2023-12-20 DIAGNOSIS — Z87891 Personal history of nicotine dependence: Secondary | ICD-10-CM | POA: Insufficient documentation

## 2023-12-20 DIAGNOSIS — D0512 Intraductal carcinoma in situ of left breast: Secondary | ICD-10-CM | POA: Diagnosis present

## 2023-12-20 DIAGNOSIS — Z90710 Acquired absence of both cervix and uterus: Secondary | ICD-10-CM | POA: Diagnosis not present

## 2023-12-20 DIAGNOSIS — C50212 Malignant neoplasm of upper-inner quadrant of left female breast: Secondary | ICD-10-CM | POA: Diagnosis not present

## 2023-12-20 DIAGNOSIS — Z803 Family history of malignant neoplasm of breast: Secondary | ICD-10-CM | POA: Diagnosis not present

## 2023-12-20 DIAGNOSIS — N814 Uterovaginal prolapse, unspecified: Secondary | ICD-10-CM | POA: Diagnosis not present

## 2023-12-20 DIAGNOSIS — Z9013 Acquired absence of bilateral breasts and nipples: Secondary | ICD-10-CM | POA: Diagnosis not present

## 2023-12-20 NOTE — Assessment & Plan Note (Addendum)
 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   Rest of the breast biopsies did not show any evidence of malignancy.   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     She had a very tough time after bilateral salpingo-oophorectomy and ended up going on estrogen supplementation which she obtains from [place]. She had her MRI breast in Dec 2024, neg for malignancy.  Assessment and Plan Assessment & Plan BRCA2 gene mutation BRCA2 mutation with history of bilateral mastectomy  ( path in left breast with DCIS)prophylactic bilateral salpingo-oophorectomy. On estrogen therapy with progesterone IUD. She clearly understands this is not recommended. - Undergoes annual MRI surveillance due to increased breast cancer risk. Family history of BRCA-related cancer recurrence. Lifelong follow-up necessary. - Continue annual breast MRI surveillance. - No concerns on breast exam today - Ensure regular dermatological exams.  Constipation Recent severe constipation, atypical for her. Scheduled for colonoscopy next week for further evaluation. - Proceed with scheduled colonoscopy.  Uterine prolapse Uterine prolapse with plans for hysterectomy. - Proceed with planned hysterectomy.

## 2023-12-20 NOTE — Progress Notes (Signed)
 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   Rest of the breast biopsies did not show any evidence of malignancy.   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     She had a very tough time after bilateral salpingo-oophorectomy and ended up going on estrogen supplementation which she obtains from [place]. She had her MRI breast in Dec 2024, neg for malignancy.  Assessment and Plan Assessment & Plan BRCA2 gene mutation BRCA2 mutation with history of bilateral mastectomy  ( path in left breast with DCIS)prophylactic bilateral salpingo-oophorectomy. On estrogen therapy with progesterone IUD. She clearly understands this is not recommended. - Undergoes annual MRI surveillance due to increased breast cancer risk. Family history of BRCA-related cancer recurrence. Lifelong follow-up necessary. - Continue annual breast MRI surveillance. - No concerns on breast exam today - Ensure regular dermatological exams.  Constipation Recent severe constipation, atypical for her. Scheduled for colonoscopy next week for further evaluation. - Proceed with scheduled colonoscopy.  Uterine prolapse Uterine prolapse with plans for hysterectomy. - Proceed with planned hysterectomy.       Return to clinic in 6 months or sooner as needed.  HISTORY OF PRESENTING ILLNESS:  Ebony Watson 43  y.o. 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   INTERIM HISTORY  [name] is here for follow-up after her bilateral mastectomies.    Discussed the use of AI scribe software for clinical note transcription with the patient, who gave verbal consent to proceed.  History of Present Illness Ebony Watson is a 43 year old female with BRCA2 mutation who presents for routine follow-up. She was referred by her primary care physician to a GI doctor for a colonoscopy due to stomach issues.  She has a BRCA2 mutation and had bilateral mastectomy with DCIS identified undergone prophylactic bilateral salpingo-oophorectomy. She is on estrogen therapy and has a progesterone IUD. She is scheduled for a hysterectomy due to uterine prolapse. An MRI was performed in December, and she plans to continue annual MRIs.   She is scheduled for a colonoscopy next week due to stomach issues experienced a few months ago. She has a history of constipation, which is unusual for her, but no blood or black stools. There is no family history of colon or pancreatic cancer.  She experienced weight loss followed by weight gain, which she attributes to menopause. She is concerned about the impact of hormones but acknowledges their benefits, describing them as a 'Secretary/administrator'.  Her last blood work was done in September or October during her physical at her primary care office, which included a metabolic panel, cholesterol, and thyroid function, but not a blood count.   Rest of the pertinent 10 point ROS reviewed and negative   MEDICAL HISTORY:  Past Medical History:  Diagnosis Date   Anxiety  Asthma    BRCA2 gene mutation positive in female    Breast cancer (HCC) 02/28/2021   left breast, bilateral mastectomy   Depression    Hyperlipidemia     SURGICAL HISTORY: Past Surgical History:  Procedure Laterality Date   BREAST RECONSTRUCTION WITH PLACEMENT  OF TISSUE EXPANDER AND FLEX HD (ACELLULAR HYDRATED DERMIS) Bilateral 02/20/2021   Procedure: BILATERAL IMMEDIATE BREAST RECONSTRUCTION WITH PLACEMENT OF TISSUE EXPANDER AND FLEX HD (ACELLULAR HYDRATED DERMIS);  Surgeon: Thornell Flirt, DO;  Location: [facility];  Service: Plastics;  Laterality: Bilateral;   LIPOSUCTION Bilateral 06/19/2021   Procedure: LIPOSUCTION;  Surgeon: Thornell Flirt, DO;  Location: [facility];  Service: Plastics;  Laterality: Bilateral;   MASTECTOMY W/ SENTINEL NODE BIOPSY Left 02/20/2021   Procedure: LEFT MASTECTOMY WITH SENTINEL LYMPH NODE BIOPSY;  Surgeon: Caralyn Chandler, MD;  Location: [facility];  Service: General;  Laterality: Left;   REMOVAL OF BILATERAL TISSUE EXPANDERS WITH PLACEMENT OF BILATERAL BREAST IMPLANTS Bilateral 06/19/2021   Procedure: bilateral removal of expanders and placement of implants;  Surgeon: Thornell Flirt, DO;  Location: [facility];  Service: Plastics;  Laterality: Bilateral;   TOTAL MASTECTOMY Right 02/20/2021   Procedure: RIGHT TOTAL MASTECTOMY;  Surgeon: Caralyn Chandler, MD;  Location:  SURGERY CENTER;  Service: General;  Laterality: Right;   TUBAL LIGATION     WISDOM TOOTH EXTRACTION     XI ROBOTIC ASSISTED OOPHORECTOMY Bilateral 12/10/2020   Procedure: XI ROBOTIC ASSISTED BILATERAL  OOPHORECTOMY,  INTRAUTERINE DEVICE (IUD) INSERTION MIRENA ;  Surgeon: Suzi Essex, MD;  Location: [facility];  Service: Gynecology;  Laterality: Bilateral;    SOCIAL HISTORY: Social History   Socioeconomic History   Marital status: Single    Spouse name: Not on file   Number of children: Not on file   Years of education: Not on file   Highest education level: Not on file  Occupational History   Occupation: infection control  Tobacco Use   Smoking status: Former    Current packs/day: 0.00    Average packs/day: 0.3 packs/day for 4.0 years (1.0 ttl pk-yrs)    Types:  Cigarettes    Start date: 2003    Quit date: 2007    Years since quitting: 18.3    Passive exposure: Never   Smokeless tobacco: Never  Vaping Use   Vaping status: Never Used  Substance and Sexual Activity   Alcohol use: Yes    Comment: socially   Drug use: Never   Sexual activity: Yes    Partners: Male    Birth control/protection: Surgical    Comment: menarche 43yo, sexual debut 43yo  Other Topics Concern   Not on file  Social History Narrative   Not on file   Social Drivers of Health   Financial Resource Strain: Low Risk  (10/25/2023)   Received from [facility]   Overall Financial Resource Strain (CARDIA)    Difficulty of Paying Living Expenses: Not hard at all  Food Insecurity: No Food Insecurity (10/25/2023)   Received from [facility]   Hunger Vital Sign    Worried About Running Out of Food in the Last Year: Never true    Ran Out of Food in the Last Year: Never true  Transportation Needs: No Transportation Needs (10/25/2023)   Received from [facility] - Transportation    Lack of Transportation (Medical): No    Lack of Transportation (Non-Medical): No  Physical  Activity: Insufficiently Active (10/25/2023)   [facility] from [facility], [facility], [facility]   Exercise Vital Sign    Days of Exercise per Week: 2 days    Minutes of Exercise per Session: 30 min  Stress: No Stress Concern Present (10/25/2023)   Received from [facility] - Occupational Stress Questionnaire    Feeling of Stress : Not at all  Social Connections: Socially Integrated (10/25/2023)   Received from [facility]   Social Network    How would you rate your social network (family, work, friends)?: Good participation with social networks  Intimate Partner Violence: Not At Risk (10/25/2023)   Received from [facility]   HITS    Over the last 12 months how often did your partner physically hurt you?: Never    Over the last 12 months how often did your partner insult  you or talk down to you?: Never    Over the last 12 months how often did your partner threaten you with physical harm?: Never    Over the last 12 months how often did your partner scream or curse at you?: Never    FAMILY HISTORY: Family History  Problem Relation Age of Onset   Drug abuse Mother    Hypertension Mother    Hyperlipidemia Mother    Heart disease Father    Prostate cancer Father    Drug abuse Sister    Breast cancer Maternal Aunt    Ovarian cancer Maternal Aunt    BRCA 1/2 Maternal Aunt    Cervical cancer Sister    Drug abuse Sister     ALLERGIES:  is allergic to benzodiazepines, hydrocodone -acetaminophen , oxycodone-acetaminophen , tape, and wound dressing adhesive.  MEDICATIONS:  Current Outpatient Medications  Medication Sig Dispense Refill   ADVAIR  DISKUS 250-50 MCG/DOSE AEPB Inhale 1 puff into the lungs 2 (two) times daily.     albuterol  (VENTOLIN  HFA) 108 (90 Base) MCG/ACT inhaler Inhale 2 puffs into the lungs every 4 (four) hours as needed (Asthma).     Cholecalciferol 125 MCG (5000 UT) capsule Take 5,000 Units by mouth daily.     estradiol  (ESTRACE  VAGINAL) 0.1 MG/GM vaginal cream Place 1 g vaginally 3 (three) times a week. 42.5 g 3   estradiol  (ESTRACE ) 2 MG tablet Take by mouth.     fluticasone (FLONASE) 50 MCG/ACT nasal spray 2 sprays.     hydrOXYzine (ATARAX) 10 MG tablet Take by mouth.     levonorgestrel  (MIRENA ) 20 MCG/DAY IUD 1 each by Intrauterine route once.     loratadine (CLARITIN) 10 MG tablet Take 10 mg by mouth daily.     magnesium oxide (MAG-OX) 400 MG tablet Take by mouth.     Multiple Vitamin (MULTI-VITAMIN DAILY PO) Take 1 tablet by mouth daily.     MULTIPLE VITAMINS-MINERALS ER PO Take 1 tablet by mouth daily. (Patient not taking: Reported on 10/12/2023)     Omega-3 Fatty Acids (FISH OIL PO) Take 1 capsule by mouth daily.     Probiotic Product (PROBIOTIC DAILY PO) Take 1 tablet by mouth daily.     No current facility-administered  medications for this visit.   PHYSICAL EXAMINATION: ECOG PERFORMANCE STATUS: 0 - Asymptomatic  Vitals:   12/20/23 0927  BP: 129/76  Pulse: 77  Resp: 16  Temp: 98.5 F (36.9 C)  SpO2: 98%      Filed Weights   12/20/23 0927  Weight: 195 lb 14.4 oz (88.9 kg)     Physical Exam Constitutional:  Appearance: Normal appearance.  HENT:     Head: Normocephalic and atraumatic.  Cardiovascular:     Rate and Rhythm: Normal rate and regular rhythm.     Pulses: Normal pulses.     Heart sounds: Normal heart sounds.  Pulmonary:     Effort: Pulmonary effort is normal.     Breath sounds: Normal breath sounds.  Abdominal:     General: Abdomen is flat.     Palpations: Abdomen is soft.  Musculoskeletal:        General: Normal range of motion.     Cervical back: Normal range of motion and neck supple. No rigidity.  Lymphadenopathy:     Cervical: No cervical adenopathy.  Skin:    General: Skin is warm.  Neurological:     General: No focal deficit present.     Mental Status: She is alert.  Psychiatric:        Mood and Affect: Mood normal.   Breasts: No evidence of recurrence in the skin or presence of axillary lymphadenopathy or other regional adenopathy.  She is status post bilateral mastectomy with reconstruction. No change  LABORATORY DATA:  I have reviewed the data as listed Lab Results  Component Value Date   WBC 5.7 12/02/2020   HGB 14.6 12/02/2020   HCT 43.6 12/02/2020   MCV 96.0 12/02/2020   PLT 245 12/02/2020     Chemistry      Component Value Date/Time   NA 136 12/02/2020 0933   K 3.7 12/02/2020 0933   CL 104 12/02/2020 0933   CO2 23 12/02/2020 0933   BUN 19 12/02/2020 0933   CREATININE 0.64 12/02/2020 0933      Component Value Date/Time   CALCIUM 9.2 12/02/2020 0933       RADIOGRAPHIC STUDIES: I have personally reviewed the radiological images as listed and agreed with the findings in the report. No results found.   All questions were  answered. The patient knows to call the clinic with any problems, questions or concerns. I spent 30 minutes in the care of this patient including reviewing records, history, physical exam, counseling and coordination of care   Murleen Arms, MD 12/20/2023 9:44 AM

## 2023-12-23 HISTORY — PX: COLONOSCOPY: SHX174

## 2024-02-01 ENCOUNTER — Ambulatory Visit: Payer: BC Managed Care – PPO | Admitting: Obstetrics

## 2024-02-01 ENCOUNTER — Encounter: Payer: Self-pay | Admitting: Obstetrics

## 2024-02-01 VITALS — BP 136/88 | HR 73 | Wt 187.0 lb

## 2024-02-01 DIAGNOSIS — R3914 Feeling of incomplete bladder emptying: Secondary | ICD-10-CM

## 2024-02-01 DIAGNOSIS — N950 Postmenopausal bleeding: Secondary | ICD-10-CM

## 2024-02-01 DIAGNOSIS — Z1502 Genetic susceptibility to malignant neoplasm of ovary: Secondary | ICD-10-CM | POA: Diagnosis not present

## 2024-02-01 DIAGNOSIS — R350 Frequency of micturition: Secondary | ICD-10-CM | POA: Insufficient documentation

## 2024-02-01 DIAGNOSIS — Z1501 Genetic susceptibility to malignant neoplasm of breast: Secondary | ICD-10-CM | POA: Diagnosis not present

## 2024-02-01 DIAGNOSIS — N812 Incomplete uterovaginal prolapse: Secondary | ICD-10-CM

## 2024-02-01 DIAGNOSIS — C50212 Malignant neoplasm of upper-inner quadrant of left female breast: Secondary | ICD-10-CM

## 2024-02-01 DIAGNOSIS — N952 Postmenopausal atrophic vaginitis: Secondary | ICD-10-CM | POA: Diagnosis not present

## 2024-02-01 DIAGNOSIS — Z170 Estrogen receptor positive status [ER+]: Secondary | ICD-10-CM

## 2024-02-01 LAB — POCT URINALYSIS DIP (CLINITEK)
Bilirubin, UA: NEGATIVE
Blood, UA: NEGATIVE
Glucose, UA: NEGATIVE mg/dL
Ketones, POC UA: NEGATIVE mg/dL
Leukocytes, UA: NEGATIVE
Nitrite, UA: NEGATIVE
POC PROTEIN,UA: NEGATIVE
Spec Grav, UA: 1.02 (ref 1.010–1.025)
Urobilinogen, UA: 0.2 U/dL
pH, UA: 7 (ref 5.0–8.0)

## 2024-02-01 NOTE — Assessment & Plan Note (Addendum)
-   PVR 2mL - repeat if clinical change

## 2024-02-01 NOTE — Patient Instructions (Addendum)
 You have a stage 2 (out of 4) prolapse.  We discussed the fact that it is not life threatening but there are several treatment options. For treatment of pelvic organ prolapse, we discussed options for management including expectant management, conservative management, and surgical management, such as Kegels, a pessary, pelvic floor physical therapy, and specific surgical procedures.     We discussed two options for prolapse repair:  1) vaginal repair without mesh - Pros - safer, no mesh complications - Cons - not as strong as mesh repair, higher risk of recurrence  2) laparoscopic repair with mesh - Pros - stronger, better long-term success - Cons - risks of mesh implant (erosion into vagina or bladder, adhering to the rectum, pain) - these risks are lower than with a vaginal mesh but still exist  For treatment of stress urinary incontinence, which is leakage with physical activity/movement/strainging/coughing, we discussed expectant management versus nonsurgical options versus surgery. Nonsurgical options include weight loss, physical therapy, as well as a pessary.  Surgical options include a midurethral sling, which is a synthetic mesh sling that acts like a hammock under the urethra to prevent leakage of urine, a Burch urethropexy, and transurethral injection of a bulking agent.   Please return with a full bladder for testing at your return visit.  Please call radiology at [phone] to schedule your imaging study today

## 2024-02-01 NOTE — Assessment & Plan Note (Addendum)
-   POCT UA negative - denies bother, reports baseline void 10-12x/day - encouraged fluid management and reviewed instructions for Kegel exercises - return with full bladder to repeat CST, discussed possible simple CMG to r/o occult stress urinary incontinence

## 2024-02-01 NOTE — Assessment & Plan Note (Signed)
-   h/o left breast ER/PR + DCIS with + BRCA 2 mutation s/p bilateral mastectomy followed by Dr. Arno Bibles, did not recommend systemic estrogen replacement and advised vaginal estrogen - s/p robotic assisted laparoscopic bilateral oophorectomy with mirena  insertion in 12/10/2020 by Dr. Orvil Bland - Rx PO estradiol  by GYN, underwent counseling by Dr. Arno Bibles regarding risk of breast cancer recurrence with systemic HRT and desires to continue due to impact on QoL.

## 2024-02-01 NOTE — Progress Notes (Signed)
 New Patient Evaluation and Consultation  Referring Provider: Laine Piggs, NP PCP: Tretha Fu, MD Date of Service: 02/01/2024  SUBJECTIVE Chief Complaint: New Patient (Initial Visit) Ebony Watson is a 43 y.o. female here today for uterine prolapse.)  History of Present Illness: Ebony Watson is a 43 y.o. White or Caucasian female seen in consultation at the request of NP Chrzanowski for evaluation of grade III uterine prolapse.    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 Arno Bibles and robotic assisted laparoscopic bilateral oophorectomy with mirena  insertion in 12/10/2020 by Dr. Orvil Bland with gyn oncology. Stopped using vaginal estrogen around 2023. Transitioned from estrogen patch to oral estrogen for replacement.  Reports some spotting since started oral estrogen with "period like" symptoms such as vaginal discharge around every 3 months. Denies endometrial workup.  Used dulcolax x 2 in 09/2023 with referral to GI and started Miralax , attributed to using antibiotics.   Review of records significant for: Insomnia  Urinary Symptoms: Does not leak urine.   Day time voids 10-12 as baseline.  Nocturia: 1-2 times per night to void as baseline. Voiding dysfunction:  does not empty bladder well, sensation mostly at with 1st void where she returned to void immediately after voiding.  Patient does not use a catheter to empty bladder.  When urinating, patient feels the need to urinate multiple times in a row with 1st void Drinks: 64-80oz water per day, 20oz coffee. Used to do 1 cup of herbal tea at bedtime, avoided since nighttime frequency  UTIs: 0 UTI's in the last year.   Denies history of blood in urine, kidney or bladder stones, pyelonephritis, bladder cancer, and kidney cancer No results found for the last 90 days.   Pelvic Organ Prolapse  Symptoms:                  Patient Admits to a feeling of a bulge the vaginal area. It has been present for 2-3 years. Denies treatments  Patient Denies seeing a bulge.  This bulge is not bothersome.  Bowel Symptom: Bowel movements: 1 time(s) per day with Bristol IV stool  Stool consistency: soft  Straining: no.  Splinting: no.  Incomplete evacuation: no.  Patient Denies accidental bowel leakage / fecal incontinence Bowel regimen: none Last colonoscopy: Date 12/31/23, Results not available for review, due for repeat in 2 years [facility] Colonoscopy   This patient has no relevant Health Maintenance data.     Sexual Function Sexually active: no.  Sexual orientation: Straight Pain with sex: No  Pelvic Pain Denies pelvic pain   Past Medical History:  Past Medical History:  Diagnosis Date   Anxiety    Asthma    BRCA2 gene mutation positive in female    Breast cancer (HCC) 02/28/2021   left breast, bilateral mastectomy   Depression    Hyperlipidemia      Past Surgical History:   Past Surgical History:  Procedure Laterality Date   BREAST RECONSTRUCTION WITH PLACEMENT OF TISSUE EXPANDER AND FLEX HD (ACELLULAR HYDRATED DERMIS) Bilateral 02/20/2021   Procedure: BILATERAL IMMEDIATE BREAST RECONSTRUCTION WITH PLACEMENT OF TISSUE EXPANDER AND FLEX HD (ACELLULAR HYDRATED DERMIS);  Surgeon: Thornell Flirt, DO;  Location: [facility];  Service: Plastics;  Laterality: Bilateral;   COLONOSCOPY  12/2023   LIPOSUCTION Bilateral 06/19/2021   Procedure: LIPOSUCTION;  Surgeon: Thornell Flirt, DO;  Location:  [facility];  Service: Plastics;  Laterality: Bilateral;   MASTECTOMY W/ SENTINEL NODE BIOPSY Left 02/20/2021   Procedure: LEFT MASTECTOMY WITH SENTINEL LYMPH NODE BIOPSY;  Surgeon: Caralyn Chandler, MD;  Location: [facility];  Service: General;  Laterality: Left;   REMOVAL OF BILATERAL TISSUE EXPANDERS WITH PLACEMENT OF BILATERAL BREAST  IMPLANTS Bilateral 06/19/2021   Procedure: bilateral removal of expanders and placement of implants;  Surgeon: Thornell Flirt, DO;  Location: [facility];  Service: Plastics;  Laterality: Bilateral;   TOTAL MASTECTOMY Right 02/20/2021   Procedure: RIGHT TOTAL MASTECTOMY;  Surgeon: Caralyn Chandler, MD;  Location: [facility];  Service: General;  Laterality: Right;   TUBAL LIGATION     WISDOM TOOTH EXTRACTION     XI ROBOTIC ASSISTED OOPHORECTOMY Bilateral 12/10/2020   Procedure: XI ROBOTIC ASSISTED BILATERAL  OOPHORECTOMY,  INTRAUTERINE DEVICE (IUD) INSERTION MIRENA ;  Surgeon: Suzi Essex, MD;  Location: [facility];  Service: Gynecology;  Laterality: Bilateral;     Past OB/GYN History: OB History  Gravida Para Term Preterm AB Living  5 3 3  2 3   SAB IAB Ectopic Multiple Live Births  1 1   3     # Outcome Date GA Lbr Len/2nd Weight Sex Type Anes PTL Lv  5 SAB           4 Term     M Vag-Vacuum   LIV  3 IAB           2 Term     M Vag-Spont   LIV  1 Term     F Vag-Spont   LIV    Vaginal deliveries: 3, largest infant 7lb 13oz  Forceps/ Vacuum deliveries: 1 vacuum delivery with last delivery, Cesarean section: 0 Menopausal: Yes, at age 37, Admits to vaginal bleeding since menopause Contraception: s/p bilateral salpingectomy 08/2020 at [facility] and surgical menopause. Last pap smear.  Any history of abnormal pap smears: no.    Component Value Date/Time   DIAGPAP  10/12/2023 1134    - Negative for intraepithelial lesion or malignancy (NILM)   HPVHIGH Negative 10/12/2023 1134   ADEQPAP  10/12/2023 1134    Satisfactory for evaluation; transformation zone component PRESENT.    Medications: Patient has a current medication list which includes the following prescription(s): advair  diskus, albuterol , cholecalciferol, estradiol , estradiol , fluticasone, hydroxyzine, levonorgestrel , loratadine, magnesium oxide, multiple vitamin, omega-3 fatty acids, and probiotic  product.   Allergies: Patient is allergic to benzodiazepines, hydrocodone -acetaminophen , oxycodone-acetaminophen , tape, and wound dressing adhesive.   Social History:  Social History   Tobacco Use   Smoking status: Former    Current packs/day: 0.00    Average packs/day: 0.3 packs/day for 4.0 years (1.0 ttl pk-yrs)    Types: Cigarettes    Start date: 2003    Quit date: 2007    Years since quitting: 18.4    Passive exposure: Never   Smokeless tobacco: Never  Vaping Use   Vaping status: Never Used  Substance Use Topics   Alcohol use: Yes    Comment: socially   Drug use: Never    Relationship status: long-term partner Patient lives with her partner and children.   Patient is employed in healthcare. Regular exercise: Yes: cardio, weights History of abuse: No  Family History:   Family History  Problem Relation Age of Onset   Drug abuse Mother    Hypertension Mother    Hyperlipidemia Mother    Heart disease Father  Prostate cancer Father    Drug abuse Sister    Cervical cancer Sister    Drug abuse Sister    Breast cancer Maternal Aunt    Ovarian cancer Maternal Aunt    BRCA 1/2 Maternal Aunt    Uterine cancer Neg Hx    Renal cancer Neg Hx    Bladder Cancer Neg Hx      Review of Systems: Review of Systems  Constitutional:  Negative for fever, malaise/fatigue and weight loss.  Respiratory:  Negative for cough, shortness of breath and wheezing.   Cardiovascular:  Negative for chest pain, palpitations and leg swelling.  Gastrointestinal:  Negative for abdominal pain, blood in stool and constipation.  Genitourinary:  Negative for dysuria, frequency, hematuria and urgency.  Skin:  Negative for rash.  Neurological:  Positive for headaches. Negative for dizziness and weakness.  Endo/Heme/Allergies:  Does not bruise/bleed easily.  Psychiatric/Behavioral:  Negative for depression. The patient is not nervous/anxious.      OBJECTIVE Physical Exam: Vitals:   02/01/24  0812  BP: 136/88  Pulse: 73  Weight: 187 lb (84.8 kg)   Physical Exam Constitutional:      General: She is not in acute distress.    Appearance: Normal appearance.  Genitourinary:     Bladder and urethral meatus normal.     No lesions in the vagina.     Right Labia: No rash, tenderness, lesions, skin changes or Bartholin's cyst.    Left Labia: No tenderness, lesions, skin changes, Bartholin's cyst or rash.    No vaginal discharge, erythema, tenderness, bleeding, ulceration or granulation tissue.     Anterior, posterior and apical vaginal prolapse present.     Right Adnexa: not tender, not full and no mass present.    Left Adnexa: not tender, not full and no mass present.    No cervical motion tenderness, discharge, friability, lesion, polyp or nabothian cyst.        Uterus is prolapsed.     Uterus is not enlarged, fixed, tender or irregular.     No uterine mass detected.    Urethral meatus caruncle not present.    No urethral prolapse, tenderness, mass, hypermobility, discharge or stress urinary incontinence with cough stress test present.     Bladder is not tender, urgency on palpation not present and masses not present.      Pelvic Floor: Levator muscle strength is 3/5.    Levator ani not tender, obturator internus not tender, no asymmetrical contractions present and no pelvic spasms present.    Symmetrical pelvic sensation, anal wink present and BC reflex present. Cardiovascular:     Rate and Rhythm: Normal rate.  Pulmonary:     Effort: Pulmonary effort is normal. No respiratory distress.  Abdominal:     General: There is no distension.     Palpations: Abdomen is soft. There is no mass.     Tenderness: There is no abdominal tenderness.     Hernia: No hernia is present.    Neurological:     Mental Status: She is alert.  Vitals reviewed. Exam conducted with a chaperone present.      POP-Q:   POP-Q  0                                            Aa   0  Ba  1                                              C   5                                            Gh  3                                            Pb  9                                            tvl   0                                            Ap  0                                            Bp  -2                                              D     Post-Void Residual (PVR) by Bladder Scan: In order to evaluate bladder emptying, we discussed obtaining a postvoid residual and patient agreed to this procedure.  Procedure: The ultrasound unit was placed on the patient's abdomen in the suprapubic region after the patient had voided.    Post Void Residual - 02/01/24 0829       Post Void Residual   Post Void Residual 2 mL              Laboratory Results: Lab Results  Component Value Date   COLORU yellow 02/01/2024   CLARITYU clear 02/01/2024   GLUCOSEUR negative 02/01/2024   BILIRUBINUR negative 02/01/2024   SPECGRAV 1.020 02/01/2024   RBCUR negative 02/01/2024   PHUR 7.0 02/01/2024   UROBILINOGEN 0.2 02/01/2024   LEUKOCYTESUR Negative 02/01/2024    Lab Results  Component Value Date   CREATININE 0.64 12/02/2020    No results found for: "HGBA1C"  Lab Results  Component Value Date   HGB 14.6 12/02/2020     ASSESSMENT AND PLAN Ms. Hazelett is a 43 y.o. with:  1. Vaginal atrophy   2. Urinary frequency   3. Feeling of incomplete bladder emptying   4. Postmenopausal bleeding   5. Incomplete uterovaginal prolapse   6. Malignant neoplasm of upper-inner quadrant of left breast in female, estrogen receptor positive (HCC)     Vaginal atrophy Assessment & Plan: - using oral estrogen for supplementation - non-hormonal treatments include Vit E suppository, water/silicone based lubrication or vaginal estrogen  Orders: -     US  PELVIS TRANSVAGINAL NON-OB (  TV ONLY); Future  Urinary frequency Assessment & Plan: - POCT UA  negative - denies bother, reports baseline void 10-12x/day - encouraged fluid management and reviewed instructions for Kegel exercises - return with full bladder to repeat CST, discussed possible simple CMG to r/o occult stress urinary incontinence  Orders: -     POCT URINALYSIS DIP (CLINITEK)  Feeling of incomplete bladder emptying Assessment & Plan: - PVR 2mL - repeat if clinical change   Postmenopausal bleeding Assessment & Plan: - IUD in place, intermittent spotting started after oral estrogen for HRT Discussed evaluation is indicated to rule out precancerous or cancerous lesion and options include pelvic ultrasound or endometrial biopsy.  Risks of endometrial biopsy include bleeding, pain/cramping, infection and rare risk of uterine perforation and if that occurs possible laparoscopy/surgery.   - discussed need for Endometrial biopsy if persistent bleeding despite negative TVUS - ordered TVUS   Orders: -     US  PELVIS TRANSVAGINAL NON-OB (TV ONLY); Future  Incomplete uterovaginal prolapse Assessment & Plan: - For treatment of pelvic organ prolapse, we discussed options for management including expectant management, conservative management, and surgical management, such as Kegels, a pessary, pelvic floor physical therapy, and specific surgical procedures. We discussed two options for prolapse repair:  1) vaginal repair without mesh - Pros - safer, no mesh complications - Cons - not as strong as mesh repair, higher risk of recurrence  2) laparoscopic repair with mesh - Pros - stronger, better long-term success - Cons - risks of mesh implant (erosion into vagina or bladder, adhering to the rectum, pain) - these risks are lower than with a vaginal mesh but still exist - reviewed risks and benefits of surgical options, pt desires surgical intervention 08/2024.   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 Arno Bibles, did not recommend systemic estrogen replacement and advised vaginal estrogen - s/p robotic assisted laparoscopic bilateral oophorectomy with mirena  insertion in 12/10/2020 by Dr. Orvil Bland - Rx PO estradiol  by GYN, underwent counseling by Dr. Arno Bibles regarding risk of breast cancer recurrence with systemic HRT and desires to continue due to impact on QoL.   Time spent: I spent 65 minutes dedicated to the care of this patient on the date of this encounter to include pre-visit review of records, face-to-face time with the patient discussing stage II pelvic organ prolapse, postmenopausal bleeding, feeing of incomplete bladder emptying, vaginal atrophy, urinary frequency, and post visit documentation and ordering medication/ testing.   Darlene Ehlers, MD

## 2024-02-01 NOTE — Assessment & Plan Note (Signed)
-   For treatment of pelvic organ prolapse, we discussed options for management including expectant management, conservative management, and surgical management, such as Kegels, a pessary, pelvic floor physical therapy, and specific surgical procedures. We discussed two options for prolapse repair:  1) vaginal repair without mesh - Pros - safer, no mesh complications - Cons - not as strong as mesh repair, higher risk of recurrence  2) laparoscopic repair with mesh - Pros - stronger, better long-term success - Cons - risks of mesh implant (erosion into vagina or bladder, adhering to the rectum, pain) - these risks are lower than with a vaginal mesh but still exist - reviewed risks and benefits of surgical options, pt desires surgical intervention 08/2024.

## 2024-02-01 NOTE — Assessment & Plan Note (Addendum)
-   using oral estrogen for supplementation - non-hormonal treatments include Vit E suppository, water/silicone based lubrication or vaginal estrogen

## 2024-02-01 NOTE — Assessment & Plan Note (Signed)
-   IUD in place, intermittent spotting started after oral estrogen for HRT Discussed evaluation is indicated to rule out precancerous or cancerous lesion and options include pelvic ultrasound or endometrial biopsy.  Risks of endometrial biopsy include bleeding, pain/cramping, infection and rare risk of uterine perforation and if that occurs possible laparoscopy/surgery.   - discussed need for Endometrial biopsy if persistent bleeding despite negative TVUS - ordered TVUS

## 2024-02-11 ENCOUNTER — Ambulatory Visit (HOSPITAL_COMMUNITY): Admission: RE | Admit: 2024-02-11 | Source: Ambulatory Visit

## 2024-02-11 ENCOUNTER — Other Ambulatory Visit: Payer: Self-pay | Admitting: Obstetrics

## 2024-02-11 ENCOUNTER — Ambulatory Visit (HOSPITAL_COMMUNITY)
Admission: RE | Admit: 2024-02-11 | Discharge: 2024-02-11 | Disposition: A | Discharge status: Home / Self Care | Source: Ambulatory Visit | Attending: Obstetrics | Admitting: Obstetrics

## 2024-02-11 DIAGNOSIS — N950 Postmenopausal bleeding: Secondary | ICD-10-CM | POA: Diagnosis present

## 2024-02-11 DIAGNOSIS — C50212 Malignant neoplasm of upper-inner quadrant of left female breast: Secondary | ICD-10-CM

## 2024-02-11 DIAGNOSIS — N952 Postmenopausal atrophic vaginitis: Secondary | ICD-10-CM

## 2024-02-11 DIAGNOSIS — N812 Incomplete uterovaginal prolapse: Secondary | ICD-10-CM

## 2024-02-11 DIAGNOSIS — R350 Frequency of micturition: Secondary | ICD-10-CM

## 2024-02-11 DIAGNOSIS — R3914 Feeling of incomplete bladder emptying: Secondary | ICD-10-CM

## 2024-02-11 DIAGNOSIS — Z170 Estrogen receptor positive status [ER+]: Secondary | ICD-10-CM

## 2024-02-21 ENCOUNTER — Ambulatory Visit: Payer: Self-pay | Admitting: Obstetrics

## 2024-04-24 HISTORY — PX: ESOPHAGOGASTRODUODENOSCOPY: SHX1529

## 2024-05-11 NOTE — Progress Notes (Signed)
 [facility] Return Visit  SUBJECTIVE  History of Present Illness: Ebony Watson is a 43 y.o. female seen in follow-up for stage II pelvic organ prolapse, postmenopausal bleeding, feeing of incomplete bladder emptying, vaginal atrophy, and urinary frequency. Plan at last visit was transvaginal ultrasound, repeat CST.   Drinks: 64-80oz water per day, 20oz coffee. Used to do 1 cup of herbal tea at bedtime, avoided since nighttime frequency  Has not had an EMB, persistent PMB after TVUS desires surgical intervention 08/2024.  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 Loretha  S/p robotic assisted laparoscopic bilateral oophorectomy with mirena  insertion in 12/10/2020 by Dr. Viktoria  Vit E suppository, water/silicone based lubrication or vaginal estrogen   CLINICAL DATA:  Postmenopausal bleeding. History of bilateral oophorectomies.   EXAM: TRANSABDOMINAL AND TRANSVAGINAL ULTRASOUND OF PELVIS   TECHNIQUE: Both transabdominal and transvaginal ultrasound examinations of the pelvis were performed. Transabdominal technique was performed for global imaging of the pelvis including uterus, ovaries, adnexal regions, and pelvic cul-de-sac. It was necessary to proceed with endovaginal exam following the transabdominal exam to visualize the uterus and endometrium to better advantage.   COMPARISON:  None Available.   FINDINGS: Uterus   Measurements: 7.8 x 4.8 x 6.3 cm = volume: 121 mL. No fibroids or other mass visualized.   Endometrium   Thickness: 3 mm. Well-positioned IUD. No endometrial mass or focal lesion.   Right ovary   Surgically absent.  No right adnexal mass.   Left ovary   Surgically absent.  No left adnexal mass.   Other findings   No abnormal free fluid.   IMPRESSION: 1. Endometrium measures 3 mm in thickness. Well-positioned IUD. No endometrial mass. Provided  history was postmenopausal bleeding. If this patient is postmenopausal at age 71, then the following recommendation would apply: In the setting of post-menopausal bleeding, this is consistent with a benign etiology such as endometrial atrophy. If bleeding remains unresponsive to hormonal or medical therapy, sonohysterogram should be considered for focal lesion work-up. (Ref: Radiological Reasoning: Algorithmic Workup of Abnormal Vaginal Bleeding with Endovaginal Sonography and Sonohysterography. AJR 2008; 808:D31-26) 2. No other significant findings. Both ovaries are surgically absent. No adnexal masses.     Electronically Signed   By: Alm Parkins M.D.   On: 02/21/2024 08:32  Past Medical History: Patient  has a past medical history of Anxiety, Asthma, BRCA2 gene mutation positive in female, Breast cancer (HCC) (02/28/2021), Depression, and Hyperlipidemia.   Past Surgical History: She  has a past surgical history that includes Wisdom tooth extraction; Tubal ligation; Xi robotic assisted oophorectomy (Bilateral, 12/10/2020); Mastectomy w/ sentinel node biopsy (Left, 02/20/2021); Total mastectomy (Right, 02/20/2021); Breast reconstruction with placement of tissue expander and flex hd (acellular hydrated dermis) (Bilateral, 02/20/2021); Removal of bilateral tissue expanders with placement of bilateral breast implants (Bilateral, 06/19/2021); Liposuction (Bilateral, 06/19/2021); and Colonoscopy (12/2023).   Medications: She has a current medication list which includes the following prescription(s): advair  diskus, albuterol , cholecalciferol, estradiol , estradiol , fluticasone, hydroxyzine, levonorgestrel , loratadine, magnesium oxide, multiple vitamin, omega-3 fatty acids, and probiotic product.   Allergies: Patient is allergic to benzodiazepines, hydrocodone -acetaminophen , oxycodone-acetaminophen , tape, and wound dressing adhesive.   Social History: Patient  reports that she quit smoking  about 18 years ago. Her smoking use included cigarettes. She started smoking about 22 years ago. She has a 1 pack-year smoking history. She has never been exposed to tobacco smoke. She has  never used smokeless tobacco. She reports current alcohol use. She reports that she does not use drugs.     OBJECTIVE     Physical Exam: Vitals:   05/12/24 1145  BP: 137/84  Pulse: 68   Gen: No apparent distress, A&O x 3.  Detailed Urogynecologic Evaluation:  Deferred. Prior exam 02/01/24 showed: POP-Q   0                                            Aa   0                                           Ba   1                                              C    5                                            Gh   3                                            Pb   9                                            tvl    0                                            Ap   0                                            Bp   -2                                              D      Verbal consent was obtained to perform simple CMG procedure:   Prolapse was reduced using 2 large cotton swabs. Urethra was prepped with betadine and a 55F catheter was placed and bladder was drained completely. The bladder was then backfilled with sterile water by gravity.  First sensation: First Desire: Strong Desire: Capacity: Cough stress test was negative with reduction of prolapse in standing position. Valsalva stress test was negative.  She was was allowed to void on her own.   Interpretation: CMG showed within normal limits sensation, and normal cystometric capacity. Findings negative for stress incontinence, negative for detrusor overactivity.  Endometrial Biopsy: After informed consent was obtained and allergies confirmed, speculum was placed and cervix prepped with betadine. Pipelle was passed without resistance and sounded to 10cm. Pipelle then passed easily through os and 2 passes made with  moderate amount tissue returned. Patient tolerated with some cramping. No bleeding from tenaculum sites or from cervix. Tissue sent to pathology.   Post Void Residual - 05/12/24 1145       Post Void Residual   Post Void Residual 27 mL              ASSESSMENT AND PLAN    Ms. Tschirhart is a 43 y.o. with:  1. Postmenopausal bleeding   2. Feeling of incomplete bladder emptying   3. Incomplete uterovaginal prolapse   4. Vaginal atrophy   5. Urinary frequency     Postmenopausal bleeding Assessment & Plan: - IUD in place, intermittent spotting started after oral estrogen for HRT - Discussed evaluation is indicated to rule out precancerous or cancerous lesion and options include pelvic ultrasound or endometrial biopsy.  Risks of endometrial biopsy include bleeding, pain/cramping, infection and rare risk of uterine perforation and if that occurs possible laparoscopy/surgery.   - underwent Endometrial biopsy today - TVUS 02/21/24 uterus 7.8 x 4.8 x 6.3 cm, endometrial thickness 3mm with well-positioned IUD.  - desires to proceed with hysterectomy and IUD removal at the time of prolapse surgery   Orders: -     Surgical pathology; Future -     Ambulatory Referral For Surgery Scheduling  Feeling of incomplete bladder emptying Assessment & Plan: - prior PVR 2mL, repeat 26mL with simple CMG - repeat if clinical change   Incomplete uterovaginal prolapse Assessment & Plan: - For treatment of pelvic organ prolapse, we reviewed options for management including expectant management, conservative management, and surgical management, such as Kegels, a pessary, pelvic floor physical therapy, and specific surgical procedures. We discussed two options for prolapse repair:  1) vaginal repair without mesh - Pros - safer, no mesh complications - Cons - not as strong as mesh repair, higher risk of recurrence  2) laparoscopic repair with mesh - Pros - stronger, better long-term success - Cons -  risks of mesh implant (erosion into vagina or bladder, adhering to the rectum, pain) - these risks are lower than with a vaginal mesh but still exist - reviewed risks and benefits of surgical options, pt desires to proceed with surgical intervention.  - desires to proceed with vaginal hysterectomy, bilateral salpingectomy, uterosacral ligament suspension, possible anterior/posterior repair, perineorrhaphy, and cystourethroscopy  Orders: -     Ambulatory Referral For Surgery Scheduling  Vaginal atrophy Assessment & Plan: - using oral estrogen for supplementation - non-hormonal treatments include Vit E suppository, water/silicone based lubrication or vaginal estrogen   Urinary frequency Assessment & Plan: - 02/01/24 POCT UA negative - denies bother, reports baseline void 10-12x/day - encouraged fluid management and reviewed instructions for Kegel exercises - simple CMG with 1st sensation , 1st urge , strong desire with MCC . Negative CST - discussed risk of occult SUI in up to 40% of patient postop.  - For treatment of stress urinary incontinence,  non-surgical options include expectant management, weight loss, physical therapy, as well as a pessary.  Surgical options include a midurethral sling, Burch urethropexy, and transurethral injection of a bulking agent. - discussed an office procedure with urethral bulking (Bulkamid). We discussed success rate of approximately 70-80% and possible need for second injection. We reviewed that this is not a  permanent procedure and the Bulkamid does become less effective over time. Risks reviewed including injury to bladder or urethra, UTI, urinary retention and hematuria.  -Sling: The effectiveness of a midurethral vaginal mesh sling is approximately 85%, and thus, there will be times when you may leak urine after surgery, especially if your bladder is full or if you have a strong cough. There is a balance between making the sling tight  enough to treat your leakage but not too tight so that you have long-term difficulty emptying your bladder. A mesh sling will not directly treat overactive bladder/urge incontinence and may worsen it.  There is an FDA safety notification on vaginal mesh procedures for prolapse but NOT mesh slings. We have extensive experience and training with mesh placement and we have close postoperative follow up to identify any potential complications from mesh. It is important to realize that this mesh is a permanent implant that cannot be easily removed. There are rare risks of mesh exposure (2-4%), pain with intercourse (0-7%), and infection (<1%). The risk of mesh exposure if more likely in a woman with risks for poor healing (prior radiation, poorly controlled diabetes, or immunocompromised). The risk of new or worsened chronic pain after mesh implant is more common in women with baseline chronic pain and/or poorly controlled anxiety or depression. Approximately 2-4% of patients will experience longer-term post-operative voiding dysfunction that may require surgical revision of the sling. We also reviewed that postoperatively, her stream may not be as strong as before surgery.  - pt desires to proceed with urethral bulking  Orders: -     Ambulatory Referral For Surgery Scheduling  Plan for surgery: Exam under anesthesia, vaginal hysterectomy, bilateral salpingectomy, uterosacral ligament suspension, possible anterior/posterior repair, perineorrhaphy, urethral bulking and cystourethroscopy   - We reviewed the patient's specific anatomic and functional findings, with the assistance of diagrams, and together finalized the above procedure. The planned surgical procedures were discussed along with the surgical risks outlined below, which were also provided on a detailed handout. Additional treatment options including expectant management, conservative management, medical management were discussed where appropriate.  We  reviewed the benefits and risks of each treatment option.   General Surgical Risks: For all procedures, there are risks of bleeding, infection, damage to surrounding organs including but not limited to bowel, bladder, blood vessels, ureters and nerves, and need for further surgery if an injury were to occur. These risks are all low with minimally invasive surgery.   There are risks of numbness and weakness at any body site or buttock/rectal pain.  It is possible that baseline pain can be worsened by surgery, either with or without mesh. If surgery is vaginal, there is also a low risk of possible conversion to laparoscopy or open abdominal incision where indicated. Very rare risks include blood transfusion, blood clot, heart attack, pneumonia, or death.   There is also a risk of short-term postoperative urinary retention with need to use a catheter. About half of patients need to go home from surgery with a catheter, which is then later removed in the office. The risk of long-term need for a catheter is very low. There is also a risk of worsening of overactive bladder.   Sling: The effectiveness of a midurethral vaginal mesh sling is approximately 85%, and thus, there will be times when you may leak urine after surgery, especially if your bladder is full or if you have a strong cough. There is a balance between making the sling tight enough to  treat your leakage but not too tight so that you have long-term difficulty emptying your bladder. A mesh sling will not directly treat overactive bladder/urge incontinence and may worsen it.  There is an FDA safety notification on vaginal mesh procedures for prolapse but NOT mesh slings. We have extensive experience and training with mesh placement and we have close postoperative follow up to identify any potential complications from mesh. It is important to realize that this mesh is a permanent implant that cannot be easily removed. There are rare risks of mesh  exposure (2-4%), pain with intercourse (0-7%), and infection (<1%). The risk of mesh exposure if more likely in a woman with risks for poor healing (prior radiation, poorly controlled diabetes, or immunocompromised). The risk of new or worsened chronic pain after mesh implant is more common in women with baseline chronic pain and/or poorly controlled anxiety or depression. Approximately 2-4% of patients will experience longer-term post-operative voiding dysfunction that may require surgical revision of the sling. We also reviewed that postoperatively, her stream may not be as strong as before surgery.   Prolapse (with or without mesh): Risk factors for surgical failure  include things that put pressure on your pelvis and the surgical repair, including obesity, chronic cough, and heavy lifting or straining (including lifting children or adults, straining on the toilet, or lifting heavy objects such as furniture or anything weighing >25 lbs. Risks of recurrence is 20-30% with vaginal native tissue repair and a less than 10% with sacrocolpopexy with mesh.    Sacrocolpopexy: Mesh implants may provide more prolapse support, but do have some unique risks to consider. It is important to understand that mesh is permanent and cannot be easily removed. Risks of abdominal sacrocolpopexy mesh include mesh exposure (~3-6%), painful intercourse (recent studies show lower rates after surgery compared to before, with ~5-8% risk of new onset), and very rare risks of bowel or bladder injury or infection (<1%). The risk of mesh exposure is more likely in a woman with risks for poor healing (prior radiation, poorly controlled diabetes, or immunocompromised). The risk of new or worsened chronic pain after mesh implant is more common in women with baseline chronic pain and/or poorly controlled anxiety or depression. There is an FDA safety notification on vaginal mesh procedures for prolapse but NOT abdominal mesh procedures and  therefore does not apply to your surgery. We have extensive experience and training with mesh placement and we have close postoperative follow up to identify any potential complications from mesh.    - For preop Visit:  She is required to have a visit within 30 days of her surgery.   Today we reviewed pre-operative preparation, peri-operative expectations, and post-operative instructions/recovery.  She was provided with instructional handouts. She understands not to take aspirin (>81mg ) or NSAIDs 7 days prior to surgery. Prescriptions will be provided for: Tramadol , Ibuprofen  600mg , Tylenol  500mg , Miralax . These prescriptions will be sent prior to surgery.  Will avoid adhesive tape  - Medical clearance: required Letter sent to PCP requesting risk stratification and medical optimization. - Anticoagulant use: Yes - Medicaid Hysterectomy form: No - Accepts blood transfusion: Yes - Expected length of stay: outpatient  Request sent for surgery scheduling.   Lianne ONEIDA Gillis, MD

## 2024-05-12 ENCOUNTER — Encounter: Payer: Self-pay | Admitting: Obstetrics

## 2024-05-12 ENCOUNTER — Other Ambulatory Visit (HOSPITAL_COMMUNITY)
Admission: RE | Admit: 2024-05-12 | Discharge: 2024-05-12 | Disposition: A | Discharge status: Home / Self Care | Source: Ambulatory Visit | Attending: Obstetrics | Admitting: Obstetrics

## 2024-05-12 ENCOUNTER — Ambulatory Visit (INDEPENDENT_AMBULATORY_CARE_PROVIDER_SITE_OTHER): Admitting: Obstetrics

## 2024-05-12 VITALS — BP 137/84 | HR 68

## 2024-05-12 DIAGNOSIS — R350 Frequency of micturition: Secondary | ICD-10-CM | POA: Diagnosis not present

## 2024-05-12 DIAGNOSIS — N950 Postmenopausal bleeding: Secondary | ICD-10-CM

## 2024-05-12 DIAGNOSIS — R3914 Feeling of incomplete bladder emptying: Secondary | ICD-10-CM | POA: Diagnosis not present

## 2024-05-12 DIAGNOSIS — C50212 Malignant neoplasm of upper-inner quadrant of left female breast: Secondary | ICD-10-CM | POA: Diagnosis present

## 2024-05-12 DIAGNOSIS — N812 Incomplete uterovaginal prolapse: Secondary | ICD-10-CM

## 2024-05-12 DIAGNOSIS — N952 Postmenopausal atrophic vaginitis: Secondary | ICD-10-CM

## 2024-05-12 DIAGNOSIS — Z170 Estrogen receptor positive status [ER+]: Secondary | ICD-10-CM | POA: Insufficient documentation

## 2024-05-12 NOTE — Assessment & Plan Note (Signed)
-   IUD in place, intermittent spotting started after oral estrogen for HRT - Discussed evaluation is indicated to rule out precancerous or cancerous lesion and options include pelvic ultrasound or endometrial biopsy.  Risks of endometrial biopsy include bleeding, pain/cramping, infection and rare risk of uterine perforation and if that occurs possible laparoscopy/surgery.   - underwent Endometrial biopsy today - TVUS 02/21/24 uterus 7.8 x 4.8 x 6.3 cm, endometrial thickness 3mm with well-positioned IUD.  - desires to proceed with hysterectomy and IUD removal at the time of prolapse surgery

## 2024-05-12 NOTE — Assessment & Plan Note (Signed)
-   prior PVR 2mL, repeat 26mL with simple CMG - repeat if clinical change

## 2024-05-12 NOTE — Assessment & Plan Note (Signed)
-   For treatment of pelvic organ prolapse, we reviewed options for management including expectant management, conservative management, and surgical management, such as Kegels, a pessary, pelvic floor physical therapy, and specific surgical procedures. We discussed two options for prolapse repair:  1) vaginal repair without mesh - Pros - safer, no mesh complications - Cons - not as strong as mesh repair, higher risk of recurrence  2) laparoscopic repair with mesh - Pros - stronger, better long-term success - Cons - risks of mesh implant (erosion into vagina or bladder, adhering to the rectum, pain) - these risks are lower than with a vaginal mesh but still exist - reviewed risks and benefits of surgical options, pt desires to proceed with surgical intervention.  - desires to proceed with vaginal hysterectomy, bilateral salpingectomy, uterosacral ligament suspension, possible anterior/posterior repair, perineorrhaphy, and cystourethroscopy

## 2024-05-12 NOTE — Patient Instructions (Addendum)
 Taking Care of Yourself after endometrial biopsy   Drink plenty of water for a day or two following your procedure. Try to have about 8 ounces (one cup) at a time, and do this 6 times or more per day unless you have fluid restrictitons AVOID irritative beverages such as coffee, tea, soda, alcoholic or citrus drinks for a day or two, as this may cause burning with urination.  For the first 1-2 days after the procedure, your vaginal discharge may be pink or red in color.Call the nurse line if this happens or go to the nearest Emergency Room if the bleeding is heavy or you cannot urinate at all and it is after hours.  If it does not improve within 1-2 days, or other symptoms appear (fever, chills, or difficulty urinating) call the office to speak to a nurse.  You may return to normal daily activities such as work, school, driving, exercising and housework on the day of the procedure.

## 2024-05-12 NOTE — Assessment & Plan Note (Signed)
-   02/01/24 POCT UA negative - denies bother, reports baseline void 10-12x/day - encouraged fluid management and reviewed instructions for Kegel exercises - simple CMG with 1st sensation , 1st urge , strong desire with MCC . Negative CST - discussed risk of occult SUI in up to 40% of patient postop.  - For treatment of stress urinary incontinence,  non-surgical options include expectant management, weight loss, physical therapy, as well as a pessary.  Surgical options include a midurethral sling, Burch urethropexy, and transurethral injection of a bulking agent. - discussed an office procedure with urethral bulking (Bulkamid). We discussed success rate of approximately 70-80% and possible need for second injection. We reviewed that this is not a permanent procedure and the Bulkamid does become less effective over time. Risks reviewed including injury to bladder or urethra, UTI, urinary retention and hematuria.  -Sling: The effectiveness of a midurethral vaginal mesh sling is approximately 85%, and thus, there will be times when you may leak urine after surgery, especially if your bladder is full or if you have a strong cough. There is a balance between making the sling tight enough to treat your leakage but not too tight so that you have long-term difficulty emptying your bladder. A mesh sling will not directly treat overactive bladder/urge incontinence and may worsen it.  There is an FDA safety notification on vaginal mesh procedures for prolapse but NOT mesh slings. We have extensive experience and training with mesh placement and we have close postoperative follow up to identify any potential complications from mesh. It is important to realize that this mesh is a permanent implant that cannot be easily removed. There are rare risks of mesh exposure (2-4%), pain with intercourse (0-7%), and infection (<1%). The risk of mesh exposure if more likely in a woman with risks for poor healing  (prior radiation, poorly controlled diabetes, or immunocompromised). The risk of new or worsened chronic pain after mesh implant is more common in women with baseline chronic pain and/or poorly controlled anxiety or depression. Approximately 2-4% of patients will experience longer-term post-operative voiding dysfunction that may require surgical revision of the sling. We also reviewed that postoperatively, her stream may not be as strong as before surgery.  - pt desires to proceed with urethral bulking

## 2024-05-12 NOTE — Assessment & Plan Note (Signed)
-   using oral estrogen for supplementation - non-hormonal treatments include Vit E suppository, water/silicone based lubrication or vaginal estrogen

## 2024-05-17 ENCOUNTER — Ambulatory Visit: Payer: Self-pay | Admitting: Obstetrics

## 2024-05-17 LAB — SURGICAL PATHOLOGY

## 2024-06-06 ENCOUNTER — Encounter: Payer: Self-pay | Admitting: *Deleted

## 2024-06-22 ENCOUNTER — Telehealth: Payer: Self-pay

## 2024-06-22 NOTE — Telephone Encounter (Signed)
 Spoke with patient and confirmed appointment on 10/31

## 2024-06-23 ENCOUNTER — Encounter: Payer: Self-pay | Admitting: Hematology and Oncology

## 2024-06-23 ENCOUNTER — Inpatient Hospital Stay: Attending: Hematology and Oncology | Admitting: Hematology and Oncology

## 2024-06-23 VITALS — BP 131/66 | HR 71 | Temp 98.3°F | Resp 18 | Ht 67.0 in | Wt 193.3 lb

## 2024-06-23 DIAGNOSIS — Z1501 Genetic susceptibility to malignant neoplasm of breast: Secondary | ICD-10-CM | POA: Diagnosis not present

## 2024-06-23 DIAGNOSIS — D0512 Intraductal carcinoma in situ of left breast: Secondary | ICD-10-CM | POA: Diagnosis present

## 2024-06-23 DIAGNOSIS — Z8041 Family history of malignant neoplasm of ovary: Secondary | ICD-10-CM | POA: Insufficient documentation

## 2024-06-23 DIAGNOSIS — Z1505 Genetic susceptibility to malignant neoplasm of fallopian tube(s): Secondary | ICD-10-CM

## 2024-06-23 DIAGNOSIS — Z803 Family history of malignant neoplasm of breast: Secondary | ICD-10-CM | POA: Diagnosis not present

## 2024-06-23 DIAGNOSIS — Z8042 Family history of malignant neoplasm of prostate: Secondary | ICD-10-CM | POA: Insufficient documentation

## 2024-06-23 DIAGNOSIS — Z1502 Genetic susceptibility to malignant neoplasm of ovary: Secondary | ICD-10-CM | POA: Insufficient documentation

## 2024-06-23 DIAGNOSIS — Z9013 Acquired absence of bilateral breasts and nipples: Secondary | ICD-10-CM | POA: Diagnosis not present

## 2024-06-23 DIAGNOSIS — C50212 Malignant neoplasm of upper-inner quadrant of left female breast: Secondary | ICD-10-CM

## 2024-06-23 DIAGNOSIS — Z8049 Family history of malignant neoplasm of other genital organs: Secondary | ICD-10-CM | POA: Diagnosis not present

## 2024-06-23 DIAGNOSIS — Z1507 Genetic susceptibility to malignant neoplasm of urinary tract: Secondary | ICD-10-CM

## 2024-06-23 DIAGNOSIS — Z170 Estrogen receptor positive status [ER+]: Secondary | ICD-10-CM | POA: Diagnosis not present

## 2024-06-23 DIAGNOSIS — Z87891 Personal history of nicotine dependence: Secondary | ICD-10-CM | POA: Diagnosis not present

## 2024-06-23 DIAGNOSIS — Z1509 Genetic susceptibility to other malignant neoplasm: Secondary | ICD-10-CM

## 2024-06-23 DIAGNOSIS — Z15068 Genetic susceptibility to other malignant neoplasm of digestive system: Secondary | ICD-10-CM

## 2024-06-23 DIAGNOSIS — Z1589 Genetic susceptibility to other disease: Secondary | ICD-10-CM

## 2024-06-23 NOTE — Progress Notes (Signed)
 [facility] FOLLOW UP NOTE  Patient Care Team: Gretta Crank, MD as PCP - General (Internal Medicine) Tyree Nanetta SAILOR, RN as Oncology Nurse Navigator  CHIEF COMPLAINTS/PURPOSE OF CONSULTATION:  DCIS, BRCA 2 mutation  ASSESSMENT & PLAN:   Assessment and Plan Assessment & Plan BRCA2 gene mutation with annual breast cancer surveillance She is s.p bilateral mastectomy and BSO She is on estrogen supplementation for management of post menopausal vasomotor symptoms We have on numerous occasions discussed that this could increase risk of breast cancer She fully understands the risks and would like to continue it I will do an annual MRI given BRCA 2 mutation and ongoing estrogen supplementation. No concerns on exam today  Return to clinic in 6 months or sooner as needed.  HISTORY OF PRESENTING ILLNESS:  Ebony Watson 43 y.o. 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   INTERIM HISTORY  [name] is here for follow-up after her bilateral mastectomies.    Discussed the use of AI scribe software for clinical note transcription with the patient, who gave verbal consent to proceed.  History of Present Illness Ebony Watson is a 44 year old female with BRCA2 mutation and breast cancer who presents for follow-up regarding her ongoing treatment and upcoming hysterectomy. She already had bilateral mastectomy and BSO.  She is scheduled for a hysterectomy on December 5th, during which her progesterone IUD will be removed. [place] endometrial biopsy returned normal results.  She had a colonoscopy and endoscopy recently, both of which were unremarkable. No changes or issues in the breast area are reported, and she denies any skin lesions. She undergoes regular dermatology evaluations and has one scheduled, though she  does not recall the exact date.  She had an MRI in December of the previous year.   Rest of the pertinent 10 point ROS reviewed and negative   MEDICAL HISTORY:  Past Medical History:  Diagnosis Date   Anxiety    Asthma    BRCA2 gene mutation positive in female    Breast cancer (HCC) 02/28/2021   left breast, bilateral mastectomy   Depression    Hyperlipidemia     SURGICAL HISTORY: Past Surgical History:  Procedure Laterality Date   BREAST RECONSTRUCTION WITH PLACEMENT OF TISSUE EXPANDER AND FLEX HD (ACELLULAR HYDRATED DERMIS) Bilateral 02/20/2021   Procedure: BILATERAL IMMEDIATE BREAST RECONSTRUCTION WITH PLACEMENT OF TISSUE EXPANDER AND FLEX HD (ACELLULAR HYDRATED DERMIS);  Surgeon: Lowery Estefana RAMAN, DO;  Location: [facility];  Service: Plastics;  Laterality: Bilateral;   COLONOSCOPY  12/2023   LIPOSUCTION Bilateral 06/19/2021   Procedure: LIPOSUCTION;  Surgeon: Lowery Estefana RAMAN, DO;  Location: [facility];  Service: Plastics;  Laterality: Bilateral;   MASTECTOMY W/ SENTINEL NODE BIOPSY Left 02/20/2021   Procedure: LEFT MASTECTOMY WITH SENTINEL LYMPH NODE BIOPSY;  Surgeon: Curvin Deward MOULD, MD;  Location: [facility];  Service: General;  Laterality: Left;   REMOVAL OF BILATERAL TISSUE EXPANDERS WITH PLACEMENT OF BILATERAL BREAST IMPLANTS Bilateral 06/19/2021   Procedure: bilateral removal of expanders and placement of implants;  Surgeon: Lowery Estefana RAMAN, DO;  Location: [facility];  Service: Plastics;  Laterality: Bilateral;   TOTAL MASTECTOMY Right 02/20/2021   Procedure: RIGHT TOTAL MASTECTOMY;  Surgeon: Curvin Deward MOULD, MD;  Location: [facility];  Service: General;  Laterality: Right;   TUBAL LIGATION     WISDOM TOOTH EXTRACTION     XI ROBOTIC ASSISTED OOPHORECTOMY Bilateral 12/10/2020   Procedure: XI ROBOTIC ASSISTED BILATERAL  OOPHORECTOMY,  INTRAUTERINE DEVICE (IUD) INSERTION MIRENA ;   Surgeon: Viktoria Comer SAUNDERS, MD;  Location: [facility];  Service: Gynecology;  Laterality: Bilateral;    SOCIAL HISTORY: Social History   Socioeconomic History   Marital status: Single    Spouse name: Not on file   Number of children: Not on file   Years of education: Not on file   Highest education level: Not on file  Occupational History   Occupation: infection control  Tobacco Use   Smoking status: Former    Current packs/day: 0.00    Average packs/day: 0.3 packs/day for 4.0 years (1.0 ttl pk-yrs)    Types: Cigarettes    Start date: 2003    Quit date: 2007    Years since quitting: 18.8    Passive exposure: Never   Smokeless tobacco: Never  Vaping Use   Vaping status: Never Used  Substance and Sexual Activity   Alcohol use: Yes    Comment: socially   Drug use: Never   Sexual activity: Not Currently    Partners: Male    Birth control/protection: Surgical    Comment: menarche 43yo, sexual debut 43yo  Other Topics Concern   Not on file  Social History Narrative   Not on file   Social Drivers of Health   Financial Resource Strain: Low Risk  (10/25/2023)   Received from [facility]   Overall Financial Resource Strain (CARDIA)    Difficulty of Paying Living Expenses: Not hard at all  Food Insecurity: No Food Insecurity (10/25/2023)   Received from [facility]   Hunger Vital Sign    Within the past 12 months, you worried that your food would run out before you got the money to buy more.: Never true    Within the past 12 months, the food you bought just didn't last and you didn't have money to get more.: Never true  Transportation Needs: No Transportation Needs (10/25/2023)   Received from [facility] - Transportation    Lack of Transportation (Medical): No    Lack of Transportation (Non-Medical): No  Physical Activity: Insufficiently Active (10/25/2023)   Received from [facility]   Exercise Vital Sign    On average, how many days per week do you engage in  moderate to strenuous exercise (like a brisk walk)?: 2 days    On average, how many minutes do you engage in exercise at this level?: 30 min  Stress: No Stress Concern Present (05/16/2024)   Received from [facility] - Occupational Stress Questionnaire    Do you feel stress - tense, restless, nervous, or anxious, or unable to sleep at night because your mind is troubled all the time - these days?: Only a little  Social Connections: Socially Integrated (10/25/2023)   Received [facility]   Social Network    How would you rate your social network (family, work, friends)?: Good participation with social networks  Intimate Partner Violence: Not At Risk (10/25/2023)   Received from [facility]   HITS    Over the last 12 months how often did your partner physically hurt you?: Never    Over the last 12 months how often did your partner insult you or talk down to you?: Never    Over the  last 12 months how often did your partner threaten you with physical harm?: Never    Over the last 12 months how often did your partner scream or curse at you?: Never    FAMILY HISTORY: Family History  Problem Relation Age of Onset   Drug abuse Mother    Hypertension Mother    Hyperlipidemia Mother    Heart disease Father    Prostate cancer Father    Drug abuse Sister    Cervical cancer Sister    Drug abuse Sister    Breast cancer Maternal Aunt    Ovarian cancer Maternal Aunt    BRCA 1/2 Maternal Aunt    Uterine cancer Neg Hx    Renal cancer Neg Hx    Bladder Cancer Neg Hx     ALLERGIES:  is allergic to benzodiazepines, hydrocodone -acetaminophen , oxycodone-acetaminophen , tape, and wound dressing adhesive.  MEDICATIONS:  Current Outpatient Medications  Medication Sig Dispense Refill   ADVAIR  DISKUS 250-50 MCG/DOSE AEPB Inhale 1 puff into the lungs 2 (two) times daily.     albuterol  (VENTOLIN  HFA) 108 (90 Base) MCG/ACT inhaler Inhale 2 puffs into the  lungs every 4 (four) hours as needed (Asthma).     Cholecalciferol 125 MCG (5000 UT) capsule Take 5,000 Units by mouth daily.     estradiol  (ESTRACE  VAGINAL) 0.1 MG/GM vaginal cream Place 1 g vaginally 3 (three) times a week. 42.5 g 3   estradiol  (ESTRACE ) 2 MG tablet Take by mouth.     fluticasone (FLONASE) 50 MCG/ACT nasal spray 2 sprays.     hydrOXYzine (ATARAX) 10 MG tablet Take by mouth.     levonorgestrel  (MIRENA ) 20 MCG/DAY IUD 1 each by Intrauterine route once.     loratadine (CLARITIN) 10 MG tablet Take 10 mg by mouth daily.     magnesium oxide (MAG-OX) 400 MG tablet Take by mouth.     Multiple Vitamin (MULTI-VITAMIN DAILY PO) Take 1 tablet by mouth daily.     Omega-3 Fatty Acids (FISH OIL PO) Take 1 capsule by mouth daily.     Probiotic Product (PROBIOTIC DAILY PO) Take 1 tablet by mouth daily.     No current facility-administered medications for this visit.   PHYSICAL EXAMINATION: ECOG PERFORMANCE STATUS: 0 - Asymptomatic  Vitals:   06/23/24 0942  BP: 131/66  Pulse: 71  Resp: 18  Temp: 98.3 F (36.8 C)  SpO2: 97%      Filed Weights   06/23/24 0942  Weight: 193 lb 4.8 oz (87.7 kg)     Physical Exam Constitutional:      Appearance: Normal appearance.  HENT:     Head: Normocephalic and atraumatic.  Cardiovascular:     Rate and Rhythm: Normal rate and regular rhythm.     Pulses: Normal pulses.     Heart sounds: Normal heart sounds.  Pulmonary:     Effort: Pulmonary effort is normal.     Breath sounds: Normal breath sounds.  Abdominal:     General: Abdomen is flat.     Palpations: Abdomen is soft.  Musculoskeletal:        General: Normal range of motion.     Cervical back: Normal range of motion and neck supple. No rigidity.  Lymphadenopathy:     Cervical: No cervical adenopathy.  Skin:    General: Skin is warm.  Neurological:     General: No focal deficit present.     Mental Status: She is alert.  Psychiatric:  Mood and Affect: Mood normal.      LABORATORY DATA:  I have reviewed the data as listed Lab Results  Component Value Date   WBC 5.7 12/02/2020   HGB 14.6 12/02/2020   HCT 43.6 12/02/2020   MCV 96.0 12/02/2020   PLT 245 12/02/2020     Chemistry      Component Value Date/Time   NA 136 12/02/2020 0933   K 3.7 12/02/2020 0933   CL 104 12/02/2020 0933   CO2 23 12/02/2020 0933   BUN 19 12/02/2020 0933   CREATININE 0.64 12/02/2020 0933      Component Value Date/Time   CALCIUM 9.2 12/02/2020 0933       RADIOGRAPHIC STUDIES: I have personally reviewed the radiological images as listed and agreed with the findings in the report. No results found.   All questions were answered. The patient knows to call the clinic with any problems, questions or concerns. I spent 30 minutes in the care of this patient including reviewing records, history, physical exam, counseling and coordination of care   Amber Stalls, MD 06/23/2024 9:46 AM

## 2024-07-12 ENCOUNTER — Ambulatory Visit (INDEPENDENT_AMBULATORY_CARE_PROVIDER_SITE_OTHER): Admitting: Obstetrics and Gynecology

## 2024-07-12 ENCOUNTER — Encounter: Payer: Self-pay | Admitting: Obstetrics and Gynecology

## 2024-07-12 ENCOUNTER — Other Ambulatory Visit: Payer: Self-pay | Admitting: Obstetrics and Gynecology

## 2024-07-12 VITALS — BP 120/74 | HR 64 | Ht 67.0 in | Wt 193.0 lb

## 2024-07-12 DIAGNOSIS — Z01818 Encounter for other preprocedural examination: Secondary | ICD-10-CM

## 2024-07-12 MED ORDER — TRAMADOL HCL 50 MG PO TABS
50.0000 mg | ORAL_TABLET | Freq: Three times a day (TID) | ORAL | 0 refills | Status: AC | PRN
Start: 2024-07-12 — End: 2024-07-17

## 2024-07-12 MED ORDER — IBUPROFEN 600 MG PO TABS: 600.0000 mg | ORAL_TABLET | Freq: Four times a day (QID) | ORAL | 0 refills | Status: DC | PRN

## 2024-07-12 MED ORDER — ONDANSETRON HCL 4 MG PO TABS: 4.0000 mg | ORAL_TABLET | Freq: Three times a day (TID) | ORAL | 0 refills | Status: DC | PRN

## 2024-07-12 MED ORDER — ACETAMINOPHEN 500 MG PO TABS: 500.0000 mg | ORAL_TABLET | Freq: Four times a day (QID) | ORAL | 0 refills | Status: DC | PRN

## 2024-07-12 MED ORDER — POLYETHYLENE GLYCOL 3350 17 GM/SCOOP PO POWD: 17.0000 g | Freq: Every day | ORAL | 0 refills | Status: AC

## 2024-07-12 NOTE — H&P (Cosign Needed)
 [facility] H&P  Subjective Chief Complaint: Ebony Watson presents for a preoperative encounter.   History of Present Illness: Ebony Watson is a 43 y.o. female who presents for preoperative visit.  She is scheduled to undergo  Exam under anesthesia, vaginal hysterectomy, bilateral salpingectomy, uterosacral ligament suspension, possible anterior/posterior repair, perineorrhaphy, urethral bulking and cystourethroscopy  on 07/28/24.  Her symptoms include pelvic organ prolapse and stress urinary incontinence, and she was was found to have Stage II anterior, Stage II posterior, Stage III apical prolapse.   CMG showed: CMG showed within normal limits sensation, and normal cystometric capacity. Findings negative for stress incontinence, negative for detrusor overactivity.   Past Medical History:  Diagnosis Date   Anxiety    Asthma    BRCA2 gene mutation positive in female    Breast cancer (HCC) 02/28/2021   left breast, bilateral mastectomy   Depression    Hyperlipidemia      Past Surgical History:  Procedure Laterality Date   BREAST RECONSTRUCTION WITH PLACEMENT OF TISSUE EXPANDER AND FLEX HD (ACELLULAR HYDRATED DERMIS) Bilateral 02/20/2021   Procedure: BILATERAL IMMEDIATE BREAST RECONSTRUCTION WITH PLACEMENT OF TISSUE EXPANDER AND FLEX HD (ACELLULAR HYDRATED DERMIS);  Surgeon: Lowery Estefana RAMAN, DO;  Location: [facility];  Service: Plastics;  Laterality: Bilateral;   COLONOSCOPY  12/2023   LIPOSUCTION Bilateral 06/19/2021   Procedure: LIPOSUCTION;  Surgeon: Lowery Estefana RAMAN, DO;  Location: [facility];  Service: Plastics;  Laterality: Bilateral;   MASTECTOMY W/ SENTINEL NODE BIOPSY Left 02/20/2021   Procedure: LEFT MASTECTOMY WITH SENTINEL LYMPH NODE BIOPSY;  Surgeon: Curvin Deward MOULD, MD;  Location: [facility];  Service: General;  Laterality: Left;   REMOVAL OF BILATERAL TISSUE EXPANDERS WITH PLACEMENT OF  BILATERAL BREAST IMPLANTS Bilateral 06/19/2021   Procedure: bilateral removal of expanders and placement of implants;  Surgeon: Lowery Estefana RAMAN, DO;  Location: [facility];  Service: Plastics;  Laterality: Bilateral;   TOTAL MASTECTOMY Right 02/20/2021   Procedure: RIGHT TOTAL MASTECTOMY;  Surgeon: Curvin Deward MOULD, MD;  Location: [facility];  Service: General;  Laterality: Right;   TUBAL LIGATION     WISDOM TOOTH EXTRACTION     XI ROBOTIC ASSISTED OOPHORECTOMY Bilateral 12/10/2020   Procedure: XI ROBOTIC ASSISTED BILATERAL  OOPHORECTOMY,  INTRAUTERINE DEVICE (IUD) INSERTION MIRENA ;  Surgeon: Viktoria Comer SAUNDERS, MD;  Location: [facility];  Service: Gynecology;  Laterality: Bilateral;    is allergic to benzodiazepines, hydrocodone -acetaminophen , oxycodone-acetaminophen , tape, and wound dressing adhesive.   Family History  Problem Relation Age of Onset   Drug abuse Mother    Hypertension Mother    Hyperlipidemia Mother    Heart disease Father    Prostate cancer Father    Drug abuse Sister    Cervical cancer Sister    Drug abuse Sister    Breast cancer Maternal Aunt    Ovarian cancer Maternal Aunt    BRCA 1/2 Maternal Aunt    Uterine cancer Neg Hx    Renal cancer Neg Hx    Bladder Cancer Neg Hx     Social History   Tobacco Use   Smoking status: Former    Current packs/day: 0.00    Average packs/day: 0.3 packs/day for 4.0 years (1.0 ttl pk-yrs)    Types: Cigarettes    Start date: 2003    Quit date: 2007    Years since quitting: 18.8    Passive exposure: Never   Smokeless tobacco: Never  Vaping Use   Vaping status: Never Used  Substance Use Topics   Alcohol use: Yes    Comment: socially   Drug use: Never     Review of Systems was negative for a full 10 system review except as noted in the History of Present Illness.  No current facility-administered medications for this encounter.  Current Outpatient Medications:    acetaminophen   (TYLENOL ) 500 MG tablet, Take 1 tablet (500 mg total) by mouth every 6 (six) hours as needed (pain)., Disp: 30 tablet, Rfl: 0   ADVAIR  DISKUS 250-50 MCG/DOSE AEPB, Inhale 1 puff into the lungs 2 (two) times daily., Disp: , Rfl:    albuterol  (VENTOLIN  HFA) 108 (90 Base) MCG/ACT inhaler, Inhale 2 puffs into the lungs every 4 (four) hours as needed (Asthma)., Disp: , Rfl:    Cholecalciferol 125 MCG (5000 UT) capsule, Take 5,000 Units by mouth daily., Disp: , Rfl:    estradiol  (ESTRACE  VAGINAL) 0.1 MG/GM vaginal cream, Place 1 g vaginally 3 (three) times a week., Disp: 42.5 g, Rfl: 3   estradiol  (ESTRACE ) 2 MG tablet, Take by mouth., Disp: , Rfl:    fluticasone (FLONASE) 50 MCG/ACT nasal spray, 2 sprays., Disp: , Rfl:    hydrOXYzine (ATARAX) 10 MG tablet, Take by mouth., Disp: , Rfl:    ibuprofen  (ADVIL ) 600 MG tablet, Take 1 tablet (600 mg total) by mouth every 6 (six) hours as needed., Disp: 30 tablet, Rfl: 0   levonorgestrel  (MIRENA ) 20 MCG/DAY IUD, 1 each by Intrauterine route once., Disp: , Rfl:    loratadine (CLARITIN) 10 MG tablet, Take 10 mg by mouth daily., Disp: , Rfl:    magnesium oxide (MAG-OX) 400 MG tablet, Take by mouth., Disp: , Rfl:    Multiple Vitamin (MULTI-VITAMIN DAILY PO), Take 1 tablet by mouth daily., Disp: , Rfl:    Omega-3 Fatty Acids (FISH OIL PO), Take 1 capsule by mouth daily., Disp: , Rfl:    ondansetron  (ZOFRAN ) 4 MG tablet, Take 1 tablet (4 mg total) by mouth every 8 (eight) hours as needed for nausea or vomiting., Disp: 10 tablet, Rfl: 0   polyethylene glycol powder (GLYCOLAX /MIRALAX ) 17 GM/SCOOP powder, Take 17 g by mouth daily. Drink 17g (1 scoop) dissolved in water per day., Disp: 255 g, Rfl: 0   Probiotic Product (PROBIOTIC DAILY PO), Take 1 tablet by mouth daily., Disp: , Rfl:    traMADol  (ULTRAM ) 50 MG tablet, Take 1 tablet (50 mg total) by mouth every 8 (eight) hours as needed for up to 5 days., Disp: 15 tablet, Rfl: 0   Objective There were no vitals filed for  this visit.  Gen: NAD CV: S1 S2 RRR Lungs: Clear to auscultation bilaterally Abd: soft, nontender   Previous Pelvic Exam showed: POP-Q   0                                            Aa   0                                           Ba   1  C    5                                            Gh   3                                            Pb   9                                            tvl    0                                            Ap   0                                            Bp   -2                                                Assessment/ Plan  Assessment: The patient is a 43 y.o. year old scheduled to undergo  Exam under anesthesia, vaginal hysterectomy, bilateral salpingectomy, uterosacral ligament suspension, possible anterior/posterior repair, perineorrhaphy, urethral bulking and cystourethroscopy . Verbal consent was obtained for these procedures.  Evaluated by PCP for risk assessment.

## 2024-07-12 NOTE — Progress Notes (Signed)
 [facility] Urogynecology Pre-Operative Exam  Subjective Chief Complaint: Ebony Watson presents for a preoperative encounter.   History of Present Illness: Ebony Watson is a 43 y.o. female who presents for preoperative visit.  She is scheduled to undergo  Exam under anesthesia, vaginal hysterectomy, bilateral salpingectomy, uterosacral ligament suspension, possible anterior/posterior repair, perineorrhaphy, urethral bulking and cystourethroscopy  on 07/28/24.  Her symptoms include pelvic organ prolapse and stress urinary incontinence, and she was was found to have Stage II anterior, Stage II posterior, Stage III apical prolapse.   CMG showed: CMG showed within normal limits sensation, and normal cystometric capacity. Findings negative for stress incontinence, negative for detrusor overactivity.   Past Medical History:  Diagnosis Date   Anxiety    Asthma    BRCA2 gene mutation positive in female    Breast cancer (HCC) 02/28/2021   left breast, bilateral mastectomy   Depression    Hyperlipidemia      Past Surgical History:  Procedure Laterality Date   BREAST RECONSTRUCTION WITH PLACEMENT OF TISSUE EXPANDER AND FLEX HD (ACELLULAR HYDRATED DERMIS) Bilateral 02/20/2021   Procedure: BILATERAL IMMEDIATE BREAST RECONSTRUCTION WITH PLACEMENT OF TISSUE EXPANDER AND FLEX HD (ACELLULAR HYDRATED DERMIS);  Surgeon: Lowery Estefana RAMAN, DO;  Location: [facility];  Service: Plastics;  Laterality: Bilateral;   COLONOSCOPY  12/2023   LIPOSUCTION Bilateral 06/19/2021   Procedure: LIPOSUCTION;  Surgeon: Lowery Estefana RAMAN, DO;  Location: [facility];  Service: Plastics;  Laterality: Bilateral;   MASTECTOMY W/ SENTINEL NODE BIOPSY Left 02/20/2021   Procedure: LEFT MASTECTOMY WITH SENTINEL LYMPH NODE BIOPSY;  Surgeon: Curvin Deward MOULD, MD;  Location: [facility];  Service: General;  Laterality: Left;   REMOVAL OF BILATERAL TISSUE EXPANDERS  WITH PLACEMENT OF BILATERAL BREAST IMPLANTS Bilateral 06/19/2021   Procedure: bilateral removal of expanders and placement of implants;  Surgeon: Lowery Estefana RAMAN, DO;  Location: [facility];  Service: Plastics;  Laterality: Bilateral;   TOTAL MASTECTOMY Right 02/20/2021   Procedure: RIGHT TOTAL MASTECTOMY;  Surgeon: Curvin Deward MOULD, MD;  Location: [facility];  Service: General;  Laterality: Right;   TUBAL LIGATION     WISDOM TOOTH EXTRACTION     XI ROBOTIC ASSISTED OOPHORECTOMY Bilateral 12/10/2020   Procedure: XI ROBOTIC ASSISTED BILATERAL  OOPHORECTOMY,  INTRAUTERINE DEVICE (IUD) INSERTION MIRENA ;  Surgeon: Viktoria Comer SAUNDERS, MD;  Location: [facility];  Service: Gynecology;  Laterality: Bilateral;    is allergic to benzodiazepines, hydrocodone -acetaminophen , oxycodone-acetaminophen , tape, and wound dressing adhesive.   Family History  Problem Relation Age of Onset   Drug abuse Mother    Hypertension Mother    Hyperlipidemia Mother    Heart disease Father    Prostate cancer Father    Drug abuse Sister    Cervical cancer Sister    Drug abuse Sister    Breast cancer Maternal Aunt    Ovarian cancer Maternal Aunt    BRCA 1/2 Maternal Aunt    Uterine cancer Neg Hx    Renal cancer Neg Hx    Bladder Cancer Neg Hx     Social History   Tobacco Use   Smoking status: Former    Current packs/day: 0.00    Average packs/day: 0.3 packs/day for 4.0 years (1.0 ttl pk-yrs)    Types: Cigarettes    Start date: 2003    Quit date: 2007    Years since quitting: 18.8    Passive exposure: Never   Smokeless tobacco: Never  Vaping Use   Vaping status: Never Used  Substance Use Topics   Alcohol use: Yes    Comment: socially   Drug use: Never     Review of Systems was negative for a full 10 system review except as noted in the History of Present Illness.   Current Outpatient Medications:    ADVAIR  DISKUS 250-50 MCG/DOSE AEPB, Inhale 1 puff into the lungs 2  (two) times daily., Disp: , Rfl:    albuterol  (VENTOLIN  HFA) 108 (90 Base) MCG/ACT inhaler, Inhale 2 puffs into the lungs every 4 (four) hours as needed (Asthma)., Disp: , Rfl:    Cholecalciferol 125 MCG (5000 UT) capsule, Take 5,000 Units by mouth daily., Disp: , Rfl:    estradiol  (ESTRACE  VAGINAL) 0.1 MG/GM vaginal cream, Place 1 g vaginally 3 (three) times a week., Disp: 42.5 g, Rfl: 3   estradiol  (ESTRACE ) 2 MG tablet, Take by mouth., Disp: , Rfl:    fluticasone (FLONASE) 50 MCG/ACT nasal spray, 2 sprays., Disp: , Rfl:    hydrOXYzine (ATARAX) 10 MG tablet, Take by mouth., Disp: , Rfl:    levonorgestrel  (MIRENA ) 20 MCG/DAY IUD, 1 each by Intrauterine route once., Disp: , Rfl:    loratadine (CLARITIN) 10 MG tablet, Take 10 mg by mouth daily., Disp: , Rfl:    magnesium oxide (MAG-OX) 400 MG tablet, Take by mouth., Disp: , Rfl:    Multiple Vitamin (MULTI-VITAMIN DAILY PO), Take 1 tablet by mouth daily., Disp: , Rfl:    Omega-3 Fatty Acids (FISH OIL PO), Take 1 capsule by mouth daily., Disp: , Rfl:    Probiotic Product (PROBIOTIC DAILY PO), Take 1 tablet by mouth daily., Disp: , Rfl:    Objective There were no vitals filed for this visit.  Gen: NAD CV: S1 S2 RRR Lungs: Clear to auscultation bilaterally Abd: soft, nontender   Previous Pelvic Exam showed: POP-Q   0                                            Aa   0                                           Ba   1                                              C    5                                            Gh   3                                            Pb   9  tvl    0                                            Ap   0                                            Bp   -2                                                Assessment/ Plan  Assessment: The patient is a 43 y.o. year old scheduled to undergo  Exam under anesthesia, vaginal hysterectomy, bilateral salpingectomy,  uterosacral ligament suspension, possible anterior/posterior repair, perineorrhaphy, urethral bulking and cystourethroscopy . Verbal consent was obtained for these procedures.  Plan: General Surgical Consent: The patient has previously been counseled on alternative treatments, and the decision by the patient and provider was to proceed with the procedure listed above.  For all procedures, there are risks of bleeding, infection, damage to surrounding organs including but not limited to bowel, bladder, blood vessels, ureters and nerves, and need for further surgery if an injury were to occur. These risks are all low with minimally invasive surgery.   There are risks of numbness and weakness at any body site or buttock/rectal pain.  It is possible that baseline pain can be worsened by surgery, either with or without mesh. If surgery is vaginal, there is also a low risk of possible conversion to laparoscopy or open abdominal incision where indicated. Very rare risks include blood transfusion, blood clot, heart attack, pneumonia, or death.   There is also a risk of short-term postoperative urinary retention with need to use a catheter. About half of patients need to go home from surgery with a catheter, which is then later removed in the office. The risk of long-term need for a catheter is very low. There is also a risk of worsening of overactive bladder.   Prolapse (with or without mesh): Risk factors for surgical failure  include things that put pressure on your pelvis and the surgical repair, including obesity, chronic cough, and heavy lifting or straining (including lifting children or adults, straining on the toilet, or lifting heavy objects such as furniture or anything weighing >25 lbs. Risks of recurrence is 20-30% with vaginal native tissue repair and a less than 10% with sacrocolpopexy with mesh.    We discussed consent for blood products. Risks for blood transfusion include allergic reactions,  other reactions that can affect different body organs and managed accordingly, transmission of infectious diseases such as HIV or Hepatitis. However, the blood is screened. Patient consents for blood products.  Pre-operative instructions:  She was instructed to not take Aspirin/NSAIDs x 7days prior to surgery.  Antibiotic prophylaxis was ordered as indicated.  Catheter use: Patient will go home with foley if needed after post-operative voiding trial.  Post-operative instructions:  She was provided with specific post-operative instructions, including precautions and signs/symptoms for which we would recommend contacting us , in addition to daytime and after-hours contact phone numbers. This was provided on a handout.   Post-operative medications: Prescriptions for motrin , tylenol , miralax , and oxycodone were  sent to her pharmacy. Discussed using ibuprofen  and tylenol  on a schedule to limit use of narcotics.   Laboratory testing:  We will check labs: CBC and Type and Screen  Preoperative clearance:  PCP clearance requested   Post-operative follow-up:  A post-operative appointment will be made for 6 weeks from the date of surgery. If she needs a post-operative nurse visit for a voiding trial, that will be set up after she leaves the hospital.    Patient will call the clinic or use MyChart should anything change or any new issues arise.   Carrington Olazabal G Trenika Hudson, NP

## 2024-07-17 ENCOUNTER — Encounter: Payer: Self-pay | Admitting: Obstetrics and Gynecology

## 2024-07-17 NOTE — Telephone Encounter (Signed)
 Per chart no history of Toradol  rx's for outpatient use.

## 2024-07-18 ENCOUNTER — Encounter (HOSPITAL_COMMUNITY): Payer: Self-pay | Admitting: Obstetrics

## 2024-07-18 NOTE — Pre-Procedure Instructions (Signed)
 Surgical Instructions  Your procedure is scheduled on :   Friday,  07-28-2024 Report to [facility] Main Entrance A at 5:30 AM, then check in the Admitting office. Any questions or running late day of surgery :  call [phone]  Questions prior to your surgery day:  call [phone], Monday -- Friday 8am - 4pm. If you experience any cold or flu symptoms such as cough, fever, chills, shortness of breath, etc. between now and you scheduled surgery, please notify your surgeon office.   Remember: Do Not eat any food after midnight the night before surgery. You may have clear liquids from midnight night before surgery until 4:30 AM.   Clear liquids allowed are:  Water             Carbonated Beverages (diabetics choose diet or no sugar options)  Clear Tea ( no milk, no honey, etc.)  Black coffee ( NO MILK, CREAM OR POWDERED CREAMER OF ANY KIND)  Sport drinks, like Gatorade (diabetes choose diet or no sugar options)  NO clear liquid after 4:30 AM day of surgery.  This includes No water,  candy,  gum, and mints.  Take these medicines the morning of surgery with A SIPS OF WATER:   Advair  inhaler  May take these medicines IF NEEDED:   Albuterol  (ventolin ) inhaler Hydroxyzine (atarax) Fluticasone (flonase) nasal spray  ~~ Please bring both of your inhaler with you day of surgery  One week prior to surgery, STOP taking any Aspirin (unless otherwise instructed by your surgeon) Aleve, Naproxen, ibuprofen , Motrin , Advil , Goody's, BC's, all herbal medications/ supplements, fish oil, and non-prescription vitamins.  Do NOT Smoke (tobacco/ vaping) and Do Not drink alcohol for 24 hours prior to your procedure.  For those patients that use a CPAP.  Please bring your CPAP/ mask/ tubing with them day of surgery . Anesthesia may ask recovery room nurse to use and if you stay the night you be asked to use it.  You will be asked to removed any contacts, glasses, piercing's, hearing aid's, dentures/  partials prior to surgery.  Please bring cases/ container/ solution/ etc., for them day of surgery.   Patients discharged the day of surgery will NOT be allowed to drive home.  You must have responsible driver and caregiver to stay at home with you the next 24 hours.  SURGICAL WAITING ROOM VISITATION Patients may have no more than 2 support people in the waiting area - if more than 2 , these visitors may rotate.  Pre-op nurse will coordinate an appropriate time for 1 Adult support person, who may not rotate, to accompany patient in pre-op.  Aware some patients may have certain circumstances, speak to pre-op nurse day of surgery.  Children under the age 50 must have an adult with them who is not the patient and must remain in the main waiting area with an adult.  If the patient needs to stay at the hospital during part of their recovery, the visitor guidelines for inpatient rooms apply.  Please refer to the [facility] website for the visitor guidelines for any additional information.  If you received a COVID test during your pre-op visit it is requested that you wear a mask when out in public, stay away from anyone that may not be feeling well and notify your surgeon if you develop symptoms.  If you have been in contact with anyone that has tested positive in the past 10 days notify your surgeon.     [facility] -  Preparing for Surgery  Before surgery, you can play an important role. Because skin is not sterile, it needs to be as free of germs as possible. You can reduce the number of germs on your skin by washing with CHG (chlorhexidine  gluconate) soap before surgery. CHG is an antiseptic cleaner which kills germs and bonds with the skin to continue killing germs even after washing. Oral hygiene is also important in reducing the risk of infection. Remember to brush your teeth with your regular toothpaste the morning of surgery.  Please DO NOT use if you have an allergy to CHG or antibacterial  soaps. If your skin becomes reddened/irritated stop using the CHG and inform your Pre-op nurse day of surgery.  DO NOT shave (including legs and genital area) for at least 48 hours prior to your CHG shower.   Please follow these instructions carefully:  Shower with CHG soap the night before surgery. If you choose to wash your hair, wash your hair first as usual with your normal shampoo. After you shampoo, rinse your hair and body thoroughly to remove the shampoo. Use CHG as you would any other liquid soap. You can apply CHG directly to the skin and wash gently with a clean washcloth or shower sponge. Apply the CHG soap to your body ONLY FROM THE NECK DOWN. Do not use on open wounds or open sores. Avoid contact with your eyes, ears, mouth, and genitals (private parts). Wash genitals (private parts) with your normal soap. Wash thoroughly, paying special attention to the area where your surgery will be performed. Thoroughly rinse your body with warm water from the neck down. DO NOT shower/wash with your normal soap after using and rinsing off the CHG soap. DO NOT use lotions, oils, etc., after showering with CHG. Pat yourself dry with a clean towel. Wear clean pajamas. Place clean sheets on your bed the night of your CHG shower and do not sleep with pets.  Day of Surgery  DO NOT Apply any lotions,  powder,  oils,  deodorants (may use underarm deodorant),  cologne/  perfumes  or makeup Do Not wear jewelry /  piercing's/  metal/  permanent jewelry must be removed prior to arrival day of surgery. (No plastic piercing) Do Not wear nail polish,  gel polish,  artificial nails, or any other type of covering on natural finger nails (toe nails are okay) Remember to brush your teeth and rinse mouth out. Put on clean / comfortable clothes. [facility] is not responsible for valuables/ personal belongings

## 2024-07-18 NOTE — Progress Notes (Addendum)
 Spoke w/ via phone for pre-op interview--- pt Lab needs dos----   no      Lab results------ lab appt 07-27-2024 2 0900 getting CBC/ T&S COVID test -----patient states asymptomatic no test needed Arrive at -------  0530 on 07-28-2024  NPO after MN NO Solid Food.  Clear liquids from MN until--- 0430 Pre-Surgery Ensure or G2: n/a  Med rec completed Medications to take morning of surgery ----- advair  inhaler (asked to bring both inhalers dos) Diabetic medication ----- n/a  GLP1 agonist last dose: n/a GLP1 instructions:  Patient instructed no nail polish to be worn day of surgery Patient instructed to bring photo id and insurance card day of surgery Patient aware to have Driver (ride ) / caregiver    for 24 hours after surgery - sig other, aaron woodburn Patient Special Instructions ----- will pick up soap and written instructions at lab appt Pre-Op special Instructions ----- pt has pcp clearance office note dated 06-22-2024 w/ chart, Dr LOIS Gaskins  Patient verbalized understanding of instructions that were given at this phone interview. Patient denies chest pain, sob, fever, cough at the interview.

## 2024-07-25 ENCOUNTER — Encounter: Payer: Self-pay | Admitting: Obstetrics

## 2024-07-25 NOTE — Telephone Encounter (Signed)
 I'm working on this. Awaiting,  Dr.Youden to sign it.

## 2024-07-27 ENCOUNTER — Encounter (HOSPITAL_COMMUNITY)
Admission: RE | Admit: 2024-07-27 | Discharge: 2024-07-27 | Disposition: A | Source: Ambulatory Visit | Attending: Obstetrics

## 2024-07-27 DIAGNOSIS — N812 Incomplete uterovaginal prolapse: Secondary | ICD-10-CM

## 2024-07-27 LAB — CBC
HCT: 42 % (ref 36.0–46.0)
Hemoglobin: 14 g/dL (ref 12.0–15.0)
MCH: 31.1 pg (ref 26.0–34.0)
MCHC: 33.3 g/dL (ref 30.0–36.0)
MCV: 93.3 fL (ref 80.0–100.0)
Platelets: 291 K/uL (ref 150–400)
RBC: 4.5 MIL/uL (ref 3.87–5.11)
RDW: 11.8 % (ref 11.5–15.5)
WBC: 6.3 K/uL (ref 4.0–10.5)
nRBC: 0 % (ref 0.0–0.2)

## 2024-07-27 LAB — TYPE AND SCREEN
ABO/RH(D): O POS
Antibody Screen: NEGATIVE

## 2024-07-27 NOTE — Anesthesia Preprocedure Evaluation (Addendum)
 Anesthesia Evaluation  Patient identified by MRN, date of birth, ID band Patient awake    Reviewed: Allergy & Precautions, NPO status , Patient's Chart, lab work & pertinent test results  Airway Mallampati: II  TM Distance: >3 FB     Dental no notable dental hx. 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 Bx no ChemoRx or RT 2022 HLD  Renal/GU negative Renal ROS   Urinary frequency SUI    Musculoskeletal negative musculoskeletal ROS (+)    Abdominal   Peds  Hematology negative hematology ROS (+) Lab Results      Component                Value               Date                      WBC                      6.3                 07/27/2024                HGB                      14.0                07/27/2024                HCT                      42.0                07/27/2024                MCV                      93.3                07/27/2024                PLT                      291                 07/27/2024              Anesthesia Other Findings   Reproductive/Obstetrics Uterovaginal prolapse PMB                              Anesthesia Physical Anesthesia Plan  ASA: 2  Anesthesia Plan: General   Post-op Pain Management: Dilaudid  IV, Precedex  and Tylenol  PO (pre-op)*   Induction: Intravenous  PONV Risk Score and Plan: 4 or greater and Treatment may vary due to age or medical condition, Midazolam , Scopolamine  patch - Pre-op, Ondansetron  and Dexamethasone   Airway Management Planned: Oral  ETT  Additional Equipment: None  Intra-op  Plan:   Post-operative Plan: Extubation in OR  Informed Consent: I have reviewed the patients History and Physical, chart, labs and discussed the procedure including the risks, benefits and alternatives for the proposed anesthesia with the patient or authorized representative who has indicated his/her understanding and acceptance.     Dental advisory given  Plan Discussed with: CRNA and Anesthesiologist  Anesthesia Plan Comments:          Anesthesia Quick Evaluation

## 2024-07-28 ENCOUNTER — Encounter (HOSPITAL_COMMUNITY): Admission: RE | Disposition: A | Payer: Self-pay | Source: Home / Self Care | Attending: Obstetrics

## 2024-07-28 ENCOUNTER — Encounter (HOSPITAL_COMMUNITY): Payer: Self-pay | Admitting: Obstetrics

## 2024-07-28 ENCOUNTER — Ambulatory Visit (HOSPITAL_COMMUNITY): Payer: Self-pay | Admitting: Anesthesiology

## 2024-07-28 ENCOUNTER — Ambulatory Visit (HOSPITAL_COMMUNITY)
Admission: RE | Admit: 2024-07-28 | Discharge: 2024-07-28 | Disposition: A | Attending: Obstetrics | Admitting: Obstetrics

## 2024-07-28 DIAGNOSIS — N812 Incomplete uterovaginal prolapse: Secondary | ICD-10-CM

## 2024-07-28 DIAGNOSIS — N950 Postmenopausal bleeding: Secondary | ICD-10-CM

## 2024-07-28 DIAGNOSIS — Z01818 Encounter for other preprocedural examination: Secondary | ICD-10-CM

## 2024-07-28 HISTORY — PX: UTEROSACRAL LIGAMENT SUSPENSION: SHX7698

## 2024-07-28 HISTORY — PX: CYSTOSCOPY: SHX5120

## 2024-07-28 HISTORY — DX: Allergic rhinitis, unspecified: J30.9

## 2024-07-28 HISTORY — PX: HYSTERECTOMY, VAGINAL, WITH SALPINGECTOMY: SHX7588

## 2024-07-28 HISTORY — PX: EXAM UNDER ANESTHESIA, PELVIC: SHX7461

## 2024-07-28 HISTORY — DX: Stress incontinence (female) (male): N39.3

## 2024-07-28 HISTORY — PX: PERINEOPLASTY: SHX2218

## 2024-07-28 HISTORY — DX: Moderate persistent asthma, uncomplicated: J45.40

## 2024-07-28 HISTORY — DX: Incomplete uterovaginal prolapse: N81.2

## 2024-07-28 HISTORY — DX: Presence of spectacles and contact lenses: Z97.3

## 2024-07-28 HISTORY — DX: Feeling of incomplete bladder emptying: R39.14

## 2024-07-28 HISTORY — DX: Nontoxic single thyroid nodule: E04.1

## 2024-07-28 HISTORY — PX: INJECTION, BULKING AGENT, URETHRA: SHX7596

## 2024-07-28 HISTORY — PX: ANTERIOR AND POSTERIOR REPAIR: SHX5121

## 2024-07-28 SURGERY — HYSTERECTOMY, VAGINAL, WITH SALPINGECTOMY
Anesthesia: General | Site: Vagina

## 2024-07-28 MED ORDER — 0.9 % SODIUM CHLORIDE (POUR BTL) OPTIME
TOPICAL | Status: DC | PRN
  Administered 2024-07-28: 1000 mL

## 2024-07-28 MED ORDER — LIDOCAINE-EPINEPHRINE 1 %-1:100000 IJ SOLN
INTRAMUSCULAR | Status: DC | PRN
  Administered 2024-07-28: 40 mL

## 2024-07-28 MED ORDER — ONDANSETRON HCL 4 MG/2ML IJ SOLN: 4.0000 mg | Freq: Once | INTRAMUSCULAR | Status: DC | PRN

## 2024-07-28 MED ORDER — MORPHINE SULFATE (PF) 2 MG/ML IV SOLN
INTRAVENOUS | Status: AC
  Filled 2024-07-28: qty 1

## 2024-07-28 MED ORDER — ENOXAPARIN SODIUM 40 MG/0.4ML IJ SOSY
40.0000 mg | PREFILLED_SYRINGE | INTRAMUSCULAR | Status: AC
  Administered 2024-07-28: 40 mg via SUBCUTANEOUS

## 2024-07-28 MED ORDER — LIDOCAINE-EPINEPHRINE 1 %-1:100000 IJ SOLN
INTRAMUSCULAR | Status: AC
  Filled 2024-07-28: qty 2

## 2024-07-28 MED ORDER — SCOPOLAMINE 1 MG/3DAYS TD PT72
1.0000 | MEDICATED_PATCH | TRANSDERMAL | Status: DC
  Administered 2024-07-28: 1 mg via TRANSDERMAL

## 2024-07-28 MED ORDER — DEXMEDETOMIDINE HCL IN NACL 200 MCG/50ML IV SOLN
INTRAVENOUS | Status: DC | PRN
  Administered 2024-07-28: 8 ug via INTRAVENOUS
  Administered 2024-07-28: 12 ug via INTRAVENOUS

## 2024-07-28 MED ORDER — SODIUM CHLORIDE 0.9 % IR SOLN
Status: DC | PRN
  Administered 2024-07-28: 1000 mL via INTRAVESICAL

## 2024-07-28 MED ORDER — KETOROLAC TROMETHAMINE 15 MG/ML IJ SOLN
15.0000 mg | Freq: Once | INTRAMUSCULAR | Status: AC
  Administered 2024-07-28: 15 mg via INTRAVENOUS

## 2024-07-28 MED ORDER — ACETAMINOPHEN 500 MG PO TABS
1000.0000 mg | ORAL_TABLET | ORAL | Status: AC
  Administered 2024-07-28: 1000 mg via ORAL

## 2024-07-28 MED ORDER — PHENAZOPYRIDINE HCL 100 MG PO TABS
200.0000 mg | ORAL_TABLET | ORAL | Status: AC
  Administered 2024-07-28: 200 mg via ORAL

## 2024-07-28 MED ORDER — FENTANYL CITRATE (PF) 250 MCG/5ML IJ SOLN
INTRAMUSCULAR | Status: DC | PRN
  Administered 2024-07-28: 50 ug via INTRAVENOUS
  Administered 2024-07-28: 100 ug via INTRAVENOUS
  Administered 2024-07-28 (×2): 50 ug via INTRAVENOUS

## 2024-07-28 MED ORDER — DEXAMETHASONE SOD PHOSPHATE PF 10 MG/ML IJ SOLN
INTRAMUSCULAR | Status: DC | PRN
  Administered 2024-07-28: 10 mg via INTRAVENOUS

## 2024-07-28 MED ORDER — METRONIDAZOLE 500 MG/100ML IV SOLN
500.0000 mg | Freq: Once | INTRAVENOUS | Status: AC
  Administered 2024-07-28: 500 mg via INTRAVENOUS

## 2024-07-28 MED ORDER — ENOXAPARIN SODIUM 40 MG/0.4ML IJ SOSY
PREFILLED_SYRINGE | INTRAMUSCULAR | Status: DC
  Filled 2024-07-28: qty 0.4

## 2024-07-28 MED ORDER — DROPERIDOL 2.5 MG/ML IJ SOLN: 0.6250 mg | Freq: Once | INTRAMUSCULAR | Status: DC | PRN

## 2024-07-28 MED ORDER — ROCURONIUM BROMIDE 10 MG/ML (PF) SYRINGE
PREFILLED_SYRINGE | INTRAVENOUS | Status: DC | PRN
  Administered 2024-07-28: 10 mg via INTRAVENOUS
  Administered 2024-07-28: 20 mg via INTRAVENOUS
  Administered 2024-07-28: 60 mg via INTRAVENOUS
  Administered 2024-07-28: 10 mg via INTRAVENOUS

## 2024-07-28 MED ORDER — PROPOFOL 10 MG/ML IV BOLUS
INTRAVENOUS | Status: DC | PRN
  Administered 2024-07-28: 150 mg via INTRAVENOUS

## 2024-07-28 MED ORDER — CHLORHEXIDINE GLUCONATE 0.12 % MT SOLN
OROMUCOSAL | Status: DC
  Filled 2024-07-28: qty 15

## 2024-07-28 MED ORDER — SUGAMMADEX SODIUM 200 MG/2ML IV SOLN
INTRAVENOUS | Status: DC | PRN
  Administered 2024-07-28: 200 mg via INTRAVENOUS

## 2024-07-28 MED ORDER — PROPOFOL 10 MG/ML IV BOLUS
INTRAVENOUS | Status: AC
  Filled 2024-07-28: qty 20

## 2024-07-28 MED ORDER — GABAPENTIN 300 MG PO CAPS
300.0000 mg | ORAL_CAPSULE | ORAL | Status: AC
  Administered 2024-07-28: 300 mg via ORAL

## 2024-07-28 MED ORDER — CHLORHEXIDINE GLUCONATE 0.12 % MT SOLN
15.0000 mL | Freq: Once | OROMUCOSAL | Status: AC
  Administered 2024-07-28: 15 mL via OROMUCOSAL

## 2024-07-28 MED ORDER — WHITE PETROLATUM EX OINT
TOPICAL_OINTMENT | CUTANEOUS | Status: AC
  Filled 2024-07-28: qty 28.35

## 2024-07-28 MED ORDER — ACETAMINOPHEN 500 MG PO TABS
ORAL_TABLET | ORAL | Status: DC
  Filled 2024-07-28: qty 2

## 2024-07-28 MED ORDER — ORAL CARE MOUTH RINSE: 15.0000 mL | Freq: Once | OROMUCOSAL | Status: AC

## 2024-07-28 MED ORDER — FENTANYL CITRATE (PF) 250 MCG/5ML IJ SOLN
INTRAMUSCULAR | Status: AC
  Filled 2024-07-28: qty 5

## 2024-07-28 MED ORDER — ONDANSETRON HCL 4 MG/2ML IJ SOLN
INTRAMUSCULAR | Status: DC | PRN
  Administered 2024-07-28: 4 mg via INTRAVENOUS

## 2024-07-28 MED ORDER — MIDAZOLAM HCL 2 MG/2ML IJ SOLN
INTRAMUSCULAR | Status: AC
  Filled 2024-07-28: qty 2

## 2024-07-28 MED ORDER — SCOPOLAMINE 1 MG/3DAYS TD PT72
MEDICATED_PATCH | TRANSDERMAL | Status: DC
  Filled 2024-07-28: qty 1

## 2024-07-28 MED ORDER — METRONIDAZOLE 500 MG/100ML IV SOLN
INTRAVENOUS | Status: DC
  Filled 2024-07-28: qty 100

## 2024-07-28 MED ORDER — MORPHINE SULFATE (PF) 2 MG/ML IV SOLN
1.0000 mg | INTRAVENOUS | Status: DC | PRN
  Administered 2024-07-28: 2 mg via INTRAVENOUS
  Administered 2024-07-28 (×2): 1 mg via INTRAVENOUS

## 2024-07-28 MED ORDER — LACTATED RINGERS IV SOLN: INTRAVENOUS | Status: DC

## 2024-07-28 MED ORDER — ALBUTEROL SULFATE HFA 108 (90 BASE) MCG/ACT IN AERS
INHALATION_SPRAY | RESPIRATORY_TRACT | Status: DC | PRN
  Administered 2024-07-28: 2 via RESPIRATORY_TRACT

## 2024-07-28 MED ORDER — LIDOCAINE 2% (20 MG/ML) 5 ML SYRINGE
INTRAMUSCULAR | Status: DC | PRN
  Administered 2024-07-28: 30 mg via INTRAVENOUS

## 2024-07-28 MED ORDER — GABAPENTIN 300 MG PO CAPS
ORAL_CAPSULE | ORAL | Status: DC
  Filled 2024-07-28: qty 1

## 2024-07-28 MED ORDER — SODIUM CHLORIDE 0.9 % IV SOLN
2.0000 g | INTRAVENOUS | Status: AC
  Administered 2024-07-28 (×2): 2 g via INTRAVENOUS
  Filled 2024-07-28: qty 2

## 2024-07-28 MED ORDER — PHENAZOPYRIDINE HCL 100 MG PO TABS
ORAL_TABLET | ORAL | Status: DC
  Filled 2024-07-28: qty 2

## 2024-07-28 MED ORDER — STERILE WATER FOR IRRIGATION IR SOLN
Status: DC | PRN
  Administered 2024-07-28: 1000 mL

## 2024-07-28 MED ORDER — SODIUM CHLORIDE 0.9 % IV SOLN
INTRAVENOUS | Status: DC
  Filled 2024-07-28: qty 2

## 2024-07-28 MED ORDER — KETOROLAC TROMETHAMINE 15 MG/ML IJ SOLN
INTRAMUSCULAR | Status: AC
  Filled 2024-07-28: qty 1

## 2024-07-28 MED ORDER — SODIUM CHLORIDE (PF) 0.9 % IJ SOLN
INTRAMUSCULAR | Status: AC
  Filled 2024-07-28: qty 50

## 2024-07-28 SURGICAL SUPPLY — 45 items
BAG DRAIN URO-CYSTO SKYTR STRL (DRAIN) ×7 IMPLANT
BLADE CLIPPER SENSICLIP SURGIC (BLADE) IMPLANT
BLADE SURG 10 STRL SS (BLADE) IMPLANT
BLADE SURG 15 STRL LF DISP TIS (BLADE) ×7 IMPLANT
CATH FOLEY 2WAY SLVR 5CC 12FR (CATHETERS) ×7 IMPLANT
DRAPE SHEET LG 3/4 BI-LAMINATE (DRAPES) ×7 IMPLANT
ELECTRODE REM PT RTRN 9FT ADLT (ELECTROSURGICAL) IMPLANT
GAUZE 4X4 16PLY ~~LOC~~+RFID DBL (SPONGE) ×7 IMPLANT
GLOVE BIOGEL PI IND STRL 6 (GLOVE) ×7 IMPLANT
GLOVE BIOGEL PI IND STRL 7.0 (GLOVE) IMPLANT
GLOVE BIOGEL PI IND STRL 7.5 (GLOVE) IMPLANT
GLOVE BIOGEL PI MICRO STRL 5.5 (GLOVE) ×7 IMPLANT
GLOVE SS BIOGEL STRL SZ 7.5 (GLOVE) IMPLANT
GLOVE SS PI 5.5 STRL (GLOVE) ×7 IMPLANT
GOWN STRL REUS W/ TWL LRG LVL3 (GOWN DISPOSABLE) ×7 IMPLANT
GOWN STRL SURGICAL XL XLNG (GOWN DISPOSABLE) IMPLANT
HIBICLENS CHG 4% 4OZ BTL (MISCELLANEOUS) ×7 IMPLANT
HOLDER FOLEY CATH W/STRAP (MISCELLANEOUS) ×7 IMPLANT
IV 0.9% NACL 1000 ML (IV SOLUTION) IMPLANT
KIT TURNOVER KIT B (KITS) ×7 IMPLANT
MANIFOLD NEPTUNE II (INSTRUMENTS) ×7 IMPLANT
NDL HYPO 22X1.5 SAFETY MO (MISCELLANEOUS) ×7 IMPLANT
NDL MAYO 6 CRC TAPER PT (NEEDLE) IMPLANT
PACK VAGINAL WOMENS (CUSTOM PROCEDURE TRAY) ×7 IMPLANT
PAD OB MATERNITY 11 LF (PERSONAL CARE ITEMS) ×7 IMPLANT
RETRACTOR LONE STAR DISPOSABLE (INSTRUMENTS) ×7 IMPLANT
RETRACTOR STAY HOOK 5MM (MISCELLANEOUS) ×7 IMPLANT
SET CYSTO IRRIGATION (SET/KITS/TRAYS/PACK) ×7 IMPLANT
SLEEVE SCD COMPRESS KNEE MED (STOCKING) ×7 IMPLANT
SOLN 0.9% NACL POUR BTL 1000ML (IV SOLUTION) ×7 IMPLANT
SOLN STERILE WATER BTL 1000 ML (IV SOLUTION) IMPLANT
SPIKE FLUID TRANSFER (MISCELLANEOUS) IMPLANT
SPONGE T-LAP 18X36 ~~LOC~~+RFID STR (SPONGE) ×7 IMPLANT
SUCTION TUBE FRAZIER 10FR DISP (SUCTIONS) ×7 IMPLANT
SUT PDS AB 2-0 CT2 27 (SUTURE) ×7 IMPLANT
SUT PDS PLUS AB 0 CT-2 (SUTURE) IMPLANT
SUT VIC AB 0 CT1 27XCR 8 STRN (SUTURE) ×7 IMPLANT
SUT VIC AB 2-0 SH 27XBRD (SUTURE) ×7 IMPLANT
SUT VIC AB 3-0 SH 27X BRD (SUTURE) IMPLANT
SYR BULB EAR ULCER 3OZ GRN STR (SYRINGE) ×7 IMPLANT
SYSTEM URETHRAL BULK BULKAMID (Female Continence) IMPLANT
TOWEL GREEN STERILE (TOWEL DISPOSABLE) ×7 IMPLANT
TOWEL GREEN STERILE FF (TOWEL DISPOSABLE) ×7 IMPLANT
TRAY FOLEY W/BAG SLVR 14FR LF (SET/KITS/TRAYS/PACK) ×7 IMPLANT
UNDERPAD 30X36 HEAVY ABSORB (UNDERPADS AND DIAPERS) ×7 IMPLANT

## 2024-07-28 NOTE — Progress Notes (Signed)
 Patient placed in phase 2. Spoke with Dr. Guadlupe, patient unable to void at this time. Will give patient 25 minutes per Dr. Guadlupe and will attempt to urinate again, if unable to urinate will replace 12 Fr. Foley catheter.

## 2024-07-28 NOTE — Op Note (Addendum)
 Operative Note  Preoperative Diagnosis: Stage III pelvic organ prolapse, stress urinary incontinence  Postoperative Diagnosis: Stage III pelvic organ prolapse, stress urinary incontinence  Procedures performed:  Exam under anesthesia, vaginal hysterectomy, bilateral salpingectomy, uterosacral ligament suspension, possible anterior/posterior repair, perineorrhaphy, urethral bulking and cystourethroscopy   Implants:  Implant Name Type Inv. Item Serial No. Manufacturer Lot No. LRB No. Used Action  SYSTEM PHILL COLVIN LUEVENIA [employer] [id number] Female Continence SYSTEM URETHRAL COLVIN [employer] [id number]  1 Implanted    Attending Surgeon: Lianne Leila Gillis, MD  Assistant Surgeon: n/a  Assistant: Duwaine Birkenhead, RN  Anesthesia: General endotracheal  Findings: 1. On vaginal exam, stage III prolapse present. IUD string noted at external os.  2. On cystoscopy, normal bladder and urethral mucosa without injury or lesion. Brisk bilateral ureteral efflux present.    Specimens: * No specimens in log *  Estimated blood loss: 100 mL  IV fluids: 1500 mL  Urine output: 600 mL  Complications: none  Procedure in Detail: After informed consent was obtained, the patient was taken to the operating room where she was placed under anesthesia.  She was then placed in the dorsal lithotomy position with Allen stirrups and prepped and draped in the usual sterile fashion.  Care was taken to avoid hyperflexion or hyperextension of her lower extremities.    A self-retaining retractor was placed, and a foley catheter was placed. The cervix was grasped with two tenacula.  The cervix was injected circumferentially with 1% lidocaine  with epinephrine . A bovie was used to incise circumferentially around the cervix.  The posterior vagina was grasped with a Kocher clamp. The Mayo scissors were used to enter the posterior cul-de-sac. Palpation confirmed peritoneal entry and no adhesions. Moist laparotomy sponges were  used to pack the small bowel. The posterior peritoneum was affixed to the vaginal cuff with 0-Vicryl (this suture was used throughout unless otherwise specified) at the midline. A right angle retractor was placed through the posterior colpotomy. A Heaney clamp was then used to clamp the uterosacral ligaments on each side. These were cut and suture ligated using 0-Vicryl in a Heaney fashion, and tagged with hemostats. Anteriorly, the bladder was dissected off the pubocervical fascia using Metzenbaum scissors. A Deaver retractor was placed anteriorly to protect the bladder. The anterior peritoneal reflection was then identified, tented up and incised with Metzenbaum scissors to create an anterior colpotomy. Palpation confirmed peritoneal entry and no adhesions. The Deaver was placed anteriorly to protect the bladder. The cardinal ligaments were clamped, ligated and cut with 0-vicryl sutures in a similar fashion on each side. The uterine arteries were also clamped and ligated with 0-vicryl sutures. The cornua were clamped, cut, free-tied and suture ligated. The uterus and cervix were handed off the field.  Inspection of the pedicles revealed excellent hemostasis.  Additional moist laparotomy sponges were used to pack the small bowel. For the uterosacral ligament suspension (USLS), the bowel was packed away with an additional moistened lap pad. The posterior cuff edge was grasped with an Allis clamp.  The right and left uterosacral ligaments were identified visually and digitally. Two stitches of 0 PDS was placed through each uterosacral ligament towards its insertion site at the sacrum. These were tagged. The packing was removed. The foley catheter was removed.  A 70-degree cystoscope was introduced, and 360-degree inspection revealed no trauma in the bladder, with bilateral ureteral efflux with tension on the uterosacral sutures.  The bladder was drained and the cystoscope was removed.  The Foley  catheter was  reinserted.    For the anterior repair, two Allis clamps were placed along the midline of the anterior vaginal wall.  1% lidocaine  with epinephrine  was injected into the vaginal mucosa.  A 15 blade scalpel was used to incise the vaginal mucosa in the midline. Allis clamps were placed along this incision and Metzenbaum scissors were used to sharply dissect the epithelium off of the vesicovaginal septum bilaterally to the level of the pubic rami. Anterior plication of the vesicovaginal septum was then performed using 2-0 PDS. The vaginal mucosal edges were trimmed and the incision reapproximated with 2-0 Vicryl in a continuous running fashion. Hemostasis was noted. The uterosacral stitches were then attached to the posterior and anterior edges of the vaginal cuff on the ipsilateral sides, in a through and through fashion, using a free needle. The vaginal cuff was closed vertically in a continuous running fashion with an 0-Vicryl suture. The lateral uterosacral stitches were then tied down with good apical support noted. The Foley catheter was removed. A 70-degree cystoscope was introduced, and 360-degree inspection revealed no trauma in the bladder, with bilateral ureteral efflux. The cystoscope was removed. The medial uterosacral sutures were then tied down. Cystoscopy was repeated and brisk bilateral ureteral efflux was noted. The bladder was drained and the cystoscope was removed.  The Foley catheter was reinserted.   A 0 degree cystoscope was inserted into the urethra into the bladder.  A 360 degree view of the bladder was obtained showing no lesions or foreign bodies in the bladder.  Mucosa appeared normal.  Brisk bilateral ureteral efflux was noted bilaterally.  The cystoscope was removed and a red rubber catheter was placed to drain the bladder.  The needle was primed.  The cystoscope was inserted to the level of the bladder neck.  The needle was inserted 2 cm and the scope was pulled back into the urethra  2 cm.  The needle was inserted bevel up at the 5 o'clock position and the Bulkamid was injected to obtain coaptation.  This was repeated at the 12 o'clock,  10 o'clock and 7 o'clock positions.   A total of 2 1ml syringes were used. and good circumferential coaptation was noted.  The 12 Fr Foley catheter was reinserted.  Attention was then turned to the posterior vagina.  Two Allis clamps were placed at the introitus approximately 3cm from the urethra meatus. 1% lidocaine  with epinephrine  was injected into the vaginal mucosa in the posterior vaginal wall and perineum for hydrodissection and hemostasis. A diamond shape incision was made between these clamps with a 15 blade scalpel and a diamond shaped area of epithelium was cut at the introitus.  The rectovaginal septum was then dissected off the vaginal mucosa bilaterally. The rectovaginal septum was then plicated in a continuous running fashion with 2-0 PDS while one finger was placed in the rectum to prevent rectal penetration.  After placement of the first plication stitch two fingers were inserted into the vaginal to confirm adequate caliber.  The suture incorporated the perineal body in a U stitch fashion and the bulbocavernosus muscles. A 2-0 Vicryl was used in a subcuticular fashion to re-approximate the hymenal ring. After plication, the excess vaginal mucosa was trimmed and the vaginal mucosa was reapproximated using 2-0 Vicryl suture in a continuous fashion.  The vagina was copiously irrigated.  Hemostasis was noted. A rectal examination was normal and confirmed no sutures within the rectum. Three fingers passed through the vaginal opening without difficulty.  The  patient tolerated the procedure well.  She was awakened from anesthesia and transferred to the recovery room in stable condition. All counts were correct x 2.

## 2024-07-28 NOTE — Anesthesia Procedure Notes (Signed)
 Procedure Name: Intubation Date/Time: 07/28/2024 7:42 AM  Performed by: Evette Ade, CRNAPre-anesthesia Checklist: Patient identified, Emergency Drugs available, Suction available, Patient being monitored and Timeout performed Patient Re-evaluated:Patient Re-evaluated prior to induction Oxygen Delivery Method: Circle system utilized Preoxygenation: Pre-oxygenation with 100% oxygen Induction Type: IV induction Ventilation: Oral airway inserted - appropriate to patient size Laryngoscope Size: Miller and 3 Grade View: Grade II Tube type: Oral Tube size: 7.0 mm Number of attempts: 1 Airway Equipment and Method: Stylet Placement Confirmation: ETT inserted through vocal cords under direct vision, positive ETCO2 and breath sounds checked- equal and bilateral Secured at: 21 cm Tube secured with: Tape Dental Injury: Teeth and Oropharynx as per pre-operative assessment

## 2024-07-28 NOTE — Discharge Instructions (Addendum)

## 2024-07-28 NOTE — Telephone Encounter (Signed)
Has been signed and faxed. 

## 2024-07-28 NOTE — Transfer of Care (Signed)
 Immediate Anesthesia Transfer of Care Note  Patient: Ebony Watson  Procedure(s) Performed: HYSTERECTOMY, VAGINAL (Vagina ) UTEROSACRAL LIGAMENT SUSPENSION (Vagina ) ANTERIOR (CYSTOCELE) AND POSTERIOR REPAIR (RECTOCELE) (Vagina ) PERINEOPLASTY (Perineum) INJECTION, BULKING AGENT, URETHRA CYSTOSCOPY (Bladder) EXAM UNDER ANESTHESIA, PELVIC (Vagina )  Patient Location: PACU  Anesthesia Type:General  Level of Consciousness: drowsy  Airway & Oxygen Therapy: Patient Spontanous Breathing and Patient connected to nasal cannula oxygen  Post-op Assessment: Report given to RN and Post -op Vital signs reviewed and stable  Post vital signs: Reviewed and stable  Last Vitals:  Vitals Value Taken Time  BP 117/65 07/28/24 12:26  Temp    Pulse 81 07/28/24 12:28  Resp 15 07/28/24 12:28  SpO2 99 % 07/28/24 12:28  Vitals shown include unfiled device data.  Last Pain:  Vitals:   07/28/24 0609  TempSrc: Oral  PainSc: 0-No pain      Patients Stated Pain Goal: 6 (07/28/24 9390)  Complications: No notable events documented.

## 2024-07-28 NOTE — Interval H&P Note (Signed)
 History and Physical Interval Note:  07/28/2024 7:09 AM  Ebony Watson  has presented today for surgery, with the diagnosis of Uterovag prolpase, PMB and urinary frequency.  The various methods of treatment have been discussed with the patient and family. After consideration of risks, benefits and other options for treatment, the patient has consented to  Procedure(s): HYSTERECTOMY, VAGINAL, WITH SALPINGECTOMY (N/A) UTEROSACRAL LIGAMENT SUSPENSION (N/A) ANTERIOR (CYSTOCELE) AND POSTERIOR REPAIR (RECTOCELE)- possible (N/A) PERINEOPLASTY (N/A) INJECTION, BULKING AGENT, URETHRA (N/A) CYSTOSCOPY (N/A) EXAM UNDER ANESTHESIA, PELVIC as a surgical intervention.  The patient's history has been reviewed, patient examined, no change in status, stable for surgery.  I have reviewed the patient's chart and labs.  Questions were answered to the patient's satisfaction.    S/p preoperative risk assessment by PCP.   Ebony Watson ONEIDA Gillis

## 2024-07-31 ENCOUNTER — Encounter (HOSPITAL_COMMUNITY): Payer: Self-pay | Admitting: Obstetrics

## 2024-07-31 ENCOUNTER — Ambulatory Visit

## 2024-07-31 ENCOUNTER — Ambulatory Visit: Payer: Self-pay | Admitting: Obstetrics

## 2024-07-31 LAB — SURGICAL PATHOLOGY

## 2024-07-31 NOTE — Progress Notes (Signed)
 Ebony Watson underwent Hysterectomy, Vaginal, Uterosacral Ligament Suspension, Anterior (cystocele) And Posterior Repair (rectocele), Perineoplasty, Injection, Bulking Agent, Urethra, Cystoscopy, and Exam Under Anesthesia, Pelvic on 07/28/2024  She presents for a voiding trial.   Patient was identified with 2 identifiers.  The patient states she does not have any concerns with the foley placed.  200 mL of NS was instilled into the bladder via a catheter.  The catheter was removed and patient was instructed to void into the urinary hat.  She voided 300 mL.  The post void residual measured by bladder scan was 3 mL.  She did pass the voiding trial.  The patient was not sent home with a catheter.    The patient received aftercare instructions and will follow up as scheduled.

## 2024-07-31 NOTE — Anesthesia Postprocedure Evaluation (Signed)
 Anesthesia Post Note  Patient: [name]  Procedure(s) Performed: HYSTERECTOMY, VAGINAL (Vagina ) UTEROSACRAL LIGAMENT SUSPENSION (Vagina ) ANTERIOR (CYSTOCELE) AND POSTERIOR REPAIR (RECTOCELE) (Vagina ) PERINEOPLASTY (Perineum) INJECTION, BULKING AGENT, URETHRA CYSTOSCOPY (Bladder) EXAM UNDER ANESTHESIA, PELVIC (Vagina )     Patient location during evaluation: PACU Anesthesia Type: General Level of consciousness: awake and alert and oriented Pain management: pain level controlled Vital Signs Assessment: post-procedure vital signs reviewed and stable Respiratory status: spontaneous breathing, nonlabored ventilation and respiratory function stable Cardiovascular status: blood pressure returned to baseline and stable Postop Assessment: no apparent nausea or vomiting Anesthetic complications: no   No notable events documented.  Last Vitals:  Vitals:   07/28/24 1315 07/28/24 1330  BP: 117/66 122/61  Pulse: 68 73  Resp: 13 12  Temp:  37 C  SpO2: 99% 97%    Last Pain:  Vitals:   07/28/24 1330  TempSrc:   PainSc: 0-No pain                 [name]

## 2024-07-31 NOTE — Patient Instructions (Signed)
  Please drink lots of water and try to expel as much urine as you are inputting. If you are unable to urinate, give the office a call before 2 pm.     Please keep all future appointments and if you have any questions or concerns please feel free to contact our office at [phone].

## 2024-08-18 ENCOUNTER — Other Ambulatory Visit

## 2024-08-18 ENCOUNTER — Ambulatory Visit
Admission: RE | Admit: 2024-08-18 | Discharge: 2024-08-18 | Disposition: A | Discharge status: Home / Self Care | Source: Ambulatory Visit | Attending: Hematology and Oncology | Admitting: Hematology and Oncology

## 2024-08-18 DIAGNOSIS — Z1589 Genetic susceptibility to other disease: Secondary | ICD-10-CM

## 2024-08-18 MED ORDER — GADOPICLENOL 0.5 MMOL/ML IV SOLN
8.0000 mL | Freq: Once | INTRAVENOUS | Status: AC | PRN
  Administered 2024-08-18: 8 mL via INTRAVENOUS

## 2024-08-18 NOTE — Telephone Encounter (Signed)
 FMLA was completed and sent in.  Copy has been scanned in. KD

## 2024-09-05 ENCOUNTER — Ambulatory Visit: Admitting: Obstetrics

## 2024-09-05 ENCOUNTER — Encounter: Payer: Self-pay | Admitting: Obstetrics

## 2024-09-05 VITALS — BP 116/77 | HR 65

## 2024-09-05 DIAGNOSIS — Z48816 Encounter for surgical aftercare following surgery on the genitourinary system: Secondary | ICD-10-CM

## 2024-09-05 DIAGNOSIS — N9089 Other specified noninflammatory disorders of vulva and perineum: Secondary | ICD-10-CM | POA: Insufficient documentation

## 2024-09-05 NOTE — Patient Instructions (Signed)
 Please monitor for change in urinary or vaginal symptoms.   Dilute your urine with lukewarm water  to reduce vulvar irritation.   - discussed proper vulvar care, warm/cold compression, avoid pad use, cotton only underwear and barrier ointment with vaseline if needed

## 2024-09-05 NOTE — Assessment & Plan Note (Signed)
-   suture noted at perineum excised, silver nitrate used for hemostasis - discussed proper vulvar care, warm/cold compression, avoid pad use, cotton only underwear and barrier ointment if needed  - encouraged dilution of urine with lukewarm water  to reduce irritation during voids.

## 2024-09-05 NOTE — Progress Notes (Signed)
 Ebony Watson  Date of Visit: 09/05/2024  History of Present Illness: Ebony Watson is a 44 y.o. female scheduled today for a post-operative visit.   Surgery: s/p Exam under anesthesia, vaginal hysterectomy, bilateral salpingectomy, uterosacral ligament suspension, possible anterior/posterior repair, perineorrhaphy, urethral bulking and cystourethroscopy on 07/28/24  She passed her postoperative void trial on POD#3.   Postoperative course was uncomplicated.   Today she reports left sided internal rubbing sensation in vagina. Minimal light pink discharge, denies bleeding. Improved in the last 2-3 days. Treated for thrush, reports similar symptoms a few years ago Sinus infection with antibiotic use prior to surgery  UTI in the last 6 weeks? No  Pain? No  She has returned to her normal activity (except for postop restrictions) Vaginal bulge? No  Stress incontinence: No  Urgency/frequency: No  Urge incontinence: No  Voiding dysfunction: No  Bowel issues: No   Subjective Success: Do you usually have a bulge or something falling out that you can see or feel in the vaginal area? No  Retreatment Success: Any retreatment with surgery or pessary for any compartment? No   Pathology results:  A. UTERUS AND CERVIX, HYSTERECTOMY:  Cervix:           Unremarkable.            Negative for dysplasia or malignancy.        Endocervix:            Nabothian cysts.            Negative for hyperplasia, atypia or malignancy.        Endometrium:            Benign secretory endometrium and stromal pseudodecidualization  consistent with exogenous progestin effect.            Negative for hyperplasia, atypia or malignancy.        Myometrium:            Unremarkable.            Negative for malignancy.        Serosa:            Unremarkable.            Negative for malignancy.   Medications: She has a current medication list which includes the following prescription(s): advair diskus,  albuterol , cholecalciferol, estradiol , fluticasone, hydroxyzine, levonorgestrel , loratadine, magnesium complex, multiple vitamin, omega-3 fatty acids, and probiotic product.   Allergies: Patient is allergic to benzodiazepines, hydrocodone , oxycodone, tape, and wound dressing adhesive.   Physical Exam: BP 116/77   Pulse 65   Abdomen: soft, non-tender, without masses or organomegaly Pelvic Examination: Vagina: Incisions healing well, no significant drainage, no dehiscence, no significant erythema, superficial separation at vaginal opening with suture. Suture removed without discomfort with silver nitrate applied for hemostasis. Sutures are present at incision line and there is not granulation tissue. No tenderness along the anterior or posterior vagina. Apical tenderness with palpation of suture noted. No pelvic masses.   POP-Q: POP-Q  -3                                            Aa   -3  Ba  -6                                              C   3                                            Gh  5                                            Pb  9                                            tvl   -2                                            Ap  -2                                            Bp                                                 D   ---------------------------------------------------------  Assessment and Plan:  1. Vulvar irritation   Vulvar irritation Assessment & Plan: - suture noted at perineum excised, silver nitrate used for hemostasis - discussed proper vulvar care, warm/cold compression, avoid pad use, cotton only underwear and barrier ointment if needed  - encouraged dilution of urine with lukewarm water  to reduce irritation during voids.    - Pathology results were reviewed with the patient today and she verbalized understanding that the results were benign.  - Can resume regular activity excluding intercourse,  if  desired.  - Discussed avoidance of heavy lifting and straining long term to reduce the risk of recurrence.   All questions answered.   Return in about 6 weeks (around 10/17/2024) for 3 month postop visit.

## 2024-10-10 ENCOUNTER — Encounter: Admitting: Obstetrics

## 2024-12-22 ENCOUNTER — Inpatient Hospital Stay: Admitting: Hematology and Oncology
